# Patient Record
Sex: Male | Born: 1980 | Race: White | Hispanic: No | Smoking: Current every day smoker
Health system: Southern US, Community
[De-identification: ages and names within clinical notes are randomized; demographics above are authoritative.]

## PROBLEM LIST (undated history)

## (undated) DIAGNOSIS — E079 Disorder of thyroid, unspecified: Secondary | ICD-10-CM

## (undated) DIAGNOSIS — E785 Hyperlipidemia, unspecified: Secondary | ICD-10-CM

## (undated) DIAGNOSIS — R569 Unspecified convulsions: Secondary | ICD-10-CM

## (undated) DIAGNOSIS — F329 Major depressive disorder, single episode, unspecified: Secondary | ICD-10-CM

## (undated) DIAGNOSIS — F32A Depression, unspecified: Secondary | ICD-10-CM

## (undated) DIAGNOSIS — F4010 Social phobia, unspecified: Secondary | ICD-10-CM

## (undated) DIAGNOSIS — E2740 Unspecified adrenocortical insufficiency: Secondary | ICD-10-CM

## (undated) DIAGNOSIS — F4310 Post-traumatic stress disorder, unspecified: Secondary | ICD-10-CM

## (undated) HISTORY — PX: EAR TUBE REMOVAL: SHX1486

---

## 2014-10-29 ENCOUNTER — Encounter (HOSPITAL_COMMUNITY): Payer: Self-pay | Admitting: Emergency Medicine

## 2014-10-29 ENCOUNTER — Emergency Department (HOSPITAL_COMMUNITY)
Admission: EM | Admit: 2014-10-29 | Discharge: 2014-11-03 | Disposition: A | Discharge status: Home / Self Care | Payer: Self-pay | Attending: Emergency Medicine | Admitting: Emergency Medicine

## 2014-10-29 ENCOUNTER — Emergency Department (HOSPITAL_BASED_OUTPATIENT_CLINIC_OR_DEPARTMENT_OTHER): Payer: Self-pay

## 2014-10-29 ENCOUNTER — Encounter (HOSPITAL_BASED_OUTPATIENT_CLINIC_OR_DEPARTMENT_OTHER): Payer: Self-pay | Admitting: *Deleted

## 2014-10-29 ENCOUNTER — Emergency Department (HOSPITAL_BASED_OUTPATIENT_CLINIC_OR_DEPARTMENT_OTHER)
Admission: EM | Admit: 2014-10-29 | Discharge: 2014-10-29 | Disposition: A | Discharge status: Other Acute Inpatient Hospital | Payer: Self-pay | Attending: Emergency Medicine | Admitting: Emergency Medicine

## 2014-10-29 DIAGNOSIS — R45851 Suicidal ideations: Secondary | ICD-10-CM | POA: Insufficient documentation

## 2014-10-29 DIAGNOSIS — Z720 Tobacco use: Secondary | ICD-10-CM | POA: Insufficient documentation

## 2014-10-29 DIAGNOSIS — G40909 Epilepsy, unspecified, not intractable, without status epilepticus: Secondary | ICD-10-CM | POA: Insufficient documentation

## 2014-10-29 DIAGNOSIS — Z7952 Long term (current) use of systemic steroids: Secondary | ICD-10-CM | POA: Insufficient documentation

## 2014-10-29 DIAGNOSIS — F333 Major depressive disorder, recurrent, severe with psychotic symptoms: Secondary | ICD-10-CM | POA: Diagnosis present

## 2014-10-29 DIAGNOSIS — Z79899 Other long term (current) drug therapy: Secondary | ICD-10-CM | POA: Insufficient documentation

## 2014-10-29 DIAGNOSIS — Z8669 Personal history of other diseases of the nervous system and sense organs: Secondary | ICD-10-CM | POA: Insufficient documentation

## 2014-10-29 DIAGNOSIS — R443 Hallucinations, unspecified: Secondary | ICD-10-CM

## 2014-10-29 DIAGNOSIS — R52 Pain, unspecified: Secondary | ICD-10-CM

## 2014-10-29 DIAGNOSIS — E785 Hyperlipidemia, unspecified: Secondary | ICD-10-CM | POA: Insufficient documentation

## 2014-10-29 DIAGNOSIS — Z7951 Long term (current) use of inhaled steroids: Secondary | ICD-10-CM | POA: Insufficient documentation

## 2014-10-29 HISTORY — DX: Major depressive disorder, single episode, unspecified: F32.9

## 2014-10-29 HISTORY — DX: Unspecified convulsions: R56.9

## 2014-10-29 HISTORY — DX: Depression, unspecified: F32.A

## 2014-10-29 HISTORY — DX: Post-traumatic stress disorder, unspecified: F43.10

## 2014-10-29 HISTORY — DX: Hyperlipidemia, unspecified: E78.5

## 2014-10-29 HISTORY — DX: Unspecified adrenocortical insufficiency: E27.40

## 2014-10-29 HISTORY — DX: Social phobia, unspecified: F40.10

## 2014-10-29 LAB — URINALYSIS, ROUTINE W REFLEX MICROSCOPIC
Bilirubin Urine: NEGATIVE
GLUCOSE, UA: NEGATIVE mg/dL
HGB URINE DIPSTICK: NEGATIVE
Ketones, ur: NEGATIVE mg/dL
Leukocytes, UA: NEGATIVE
Nitrite: NEGATIVE
PROTEIN: NEGATIVE mg/dL
Specific Gravity, Urine: 1.022 (ref 1.005–1.030)
Urobilinogen, UA: 0.2 mg/dL (ref 0.0–1.0)
pH: 6.5 (ref 5.0–8.0)

## 2014-10-29 LAB — CBC WITH DIFFERENTIAL/PLATELET
BASOS ABS: 0 10*3/uL (ref 0.0–0.1)
Basophils Relative: 1 % (ref 0–1)
EOS ABS: 0.6 10*3/uL (ref 0.0–0.7)
Eosinophils Relative: 8 % — ABNORMAL HIGH (ref 0–5)
HCT: 46.1 % (ref 39.0–52.0)
Hemoglobin: 15.9 g/dL (ref 13.0–17.0)
LYMPHS ABS: 2.6 10*3/uL (ref 0.7–4.0)
Lymphocytes Relative: 34 % (ref 12–46)
MCH: 31.2 pg (ref 26.0–34.0)
MCHC: 34.5 g/dL (ref 30.0–36.0)
MCV: 90.4 fL (ref 78.0–100.0)
Monocytes Absolute: 0.8 10*3/uL (ref 0.1–1.0)
Monocytes Relative: 10 % (ref 3–12)
NEUTROS ABS: 3.6 10*3/uL (ref 1.7–7.7)
Neutrophils Relative %: 47 % (ref 43–77)
PLATELETS: 330 10*3/uL (ref 150–400)
RBC: 5.1 MIL/uL (ref 4.22–5.81)
RDW: 13.8 % (ref 11.5–15.5)
WBC: 7.6 10*3/uL (ref 4.0–10.5)

## 2014-10-29 LAB — RAPID URINE DRUG SCREEN, HOSP PERFORMED
Amphetamines: NOT DETECTED
Barbiturates: POSITIVE — AB
Benzodiazepines: POSITIVE — AB
Cocaine: NOT DETECTED
OPIATES: NOT DETECTED
TETRAHYDROCANNABINOL: NOT DETECTED

## 2014-10-29 LAB — COMPREHENSIVE METABOLIC PANEL
ALT: 22 U/L (ref 0–53)
ANION GAP: 10 (ref 5–15)
AST: 22 U/L (ref 0–37)
Albumin: 4.8 g/dL (ref 3.5–5.2)
Alkaline Phosphatase: 72 U/L (ref 39–117)
BUN: 13 mg/dL (ref 6–23)
CO2: 21 mmol/L (ref 19–32)
Calcium: 9.4 mg/dL (ref 8.4–10.5)
Chloride: 108 mmol/L (ref 96–112)
Creatinine, Ser: 0.85 mg/dL (ref 0.50–1.35)
GFR calc Af Amer: >90 mL/min (ref >90)
GFR calc non Af Amer: >90 mL/min (ref >90)
Glucose, Bld: 111 mg/dL — ABNORMAL HIGH (ref 70–99)
Potassium: 4 mmol/L (ref 3.5–5.1)
SODIUM: 139 mmol/L (ref 135–145)
TOTAL PROTEIN: 7.9 g/dL (ref 6.0–8.3)
Total Bilirubin: 0.4 mg/dL (ref 0.3–1.2)

## 2014-10-29 LAB — ETHANOL: Alcohol, Ethyl (B): <5 mg/dL (ref 0–9)

## 2014-10-29 LAB — ACETAMINOPHEN LEVEL

## 2014-10-29 LAB — SALICYLATE LEVEL: Salicylate Lvl: <4 mg/dL (ref 2.8–20.0)

## 2014-10-29 MED ORDER — HYDROCORTISONE 20 MG PO TABS
30.0000 mg | ORAL_TABLET | Freq: Every day | ORAL | Status: DC
  Filled 2014-10-29: qty 1

## 2014-10-29 MED ORDER — ZIPRASIDONE MESYLATE 20 MG IM SOLR
INTRAMUSCULAR | Status: AC
  Filled 2014-10-29: qty 20

## 2014-10-29 MED ORDER — LEVOTHYROXINE SODIUM 100 MCG PO TABS
100.0000 ug | ORAL_TABLET | Freq: Every day | ORAL | Status: DC
  Filled 2014-10-29: qty 1

## 2014-10-29 MED ORDER — NICOTINE 21 MG/24HR TD PT24
21.0000 mg | MEDICATED_PATCH | Freq: Every day | TRANSDERMAL | Status: DC
  Administered 2014-10-29: 21 mg via TRANSDERMAL
  Filled 2014-10-29: qty 1

## 2014-10-29 MED ORDER — IBUPROFEN 400 MG PO TABS: 600.0000 mg | ORAL_TABLET | Freq: Three times a day (TID) | ORAL | Status: DC | PRN

## 2014-10-29 MED ORDER — ZIPRASIDONE HCL 20 MG PO CAPS
20.0000 mg | ORAL_CAPSULE | Freq: Two times a day (BID) | ORAL | Status: DC
  Filled 2014-10-29: qty 1

## 2014-10-29 MED ORDER — RISPERIDONE 0.5 MG PO TABS: 4.0000 mg | ORAL_TABLET | Freq: Every day | ORAL | Status: DC

## 2014-10-29 MED ORDER — ZIPRASIDONE MESYLATE 20 MG IM SOLR
20.0000 mg | Freq: Once | INTRAMUSCULAR | Status: AC
  Administered 2014-10-29: 20 mg via INTRAMUSCULAR

## 2014-10-29 MED ORDER — IBUPROFEN 800 MG PO TABS
800.0000 mg | ORAL_TABLET | Freq: Once | ORAL | Status: AC
  Administered 2014-10-29: 800 mg via ORAL

## 2014-10-29 MED ORDER — HYDROCORTISONE 1 % EX CREA: TOPICAL_CREAM | Freq: Two times a day (BID) | CUTANEOUS | Status: DC

## 2014-10-29 MED ORDER — DULOXETINE HCL 60 MG PO CPEP
60.0000 mg | ORAL_CAPSULE | Freq: Every day | ORAL | Status: DC
  Filled 2014-10-29: qty 1

## 2014-10-29 MED ORDER — ALPRAZOLAM 0.5 MG PO TABS
1.0000 mg | ORAL_TABLET | Freq: Once | ORAL | Status: AC
  Administered 2014-10-29: 1 mg via ORAL
  Filled 2014-10-29: qty 2

## 2014-10-29 MED ORDER — PHENOBARBITAL 97.2 MG PO TABS
97.2000 mg | ORAL_TABLET | Freq: Three times a day (TID) | ORAL | Status: DC
  Filled 2014-10-29: qty 1

## 2014-10-29 MED ORDER — ACETAMINOPHEN 325 MG PO TABS: 650.0000 mg | ORAL_TABLET | Freq: Once | ORAL | Status: DC

## 2014-10-29 MED ORDER — IBUPROFEN 800 MG PO TABS
ORAL_TABLET | ORAL | Status: AC
  Filled 2014-10-29: qty 1

## 2014-10-29 MED ORDER — OXYCODONE-ACETAMINOPHEN 5-325 MG PO TABS
2.0000 | ORAL_TABLET | ORAL | Status: DC | PRN
  Administered 2014-10-29: 2 via ORAL
  Filled 2014-10-29: qty 2

## 2014-10-29 MED ORDER — OXYCODONE-ACETAMINOPHEN 5-325 MG PO TABS
2.0000 | ORAL_TABLET | Freq: Once | ORAL | Status: AC
Start: 2014-10-29 — End: 2014-10-29
  Administered 2014-10-29: 2 via ORAL
  Filled 2014-10-29: qty 2

## 2014-10-29 MED ORDER — LORAZEPAM 1 MG PO TABS: 1.0000 mg | ORAL_TABLET | Freq: Three times a day (TID) | ORAL | Status: DC | PRN

## 2014-10-29 MED ORDER — QUETIAPINE FUMARATE 300 MG PO TABS
300.0000 mg | ORAL_TABLET | Freq: Every day | ORAL | Status: DC
  Filled 2014-10-29: qty 1

## 2014-10-29 NOTE — ED Notes (Signed)
MD at bedside. 

## 2014-10-29 NOTE — BH Assessment (Addendum)
Tele Assessment Note   Mark [name] is an 34 y.o. male. Pt arrived voluntarily to [facility] reporting SI/HI. According to the Pt, he has 10 SI plans. Pt reports that he will become homicidal if he is released because he cannot control himself. Pt states that he is hearing demonic voices. Pt states that because he is a [place] he is upset that he is hearing demonic voices. Pt states that he has been diagnosed with depression, PTSD, and social aneixty. Pt states that he is currently prescribed Xanax, Cymbalta, Seroquel, and Risperdal. Pt reports that he was recently released from [facility] last week for SI and auditory hallucinations. Pt states that as soon as he left [facility] last week he began to have auditory hallucinations again. Pt was seen at [facility] in the ED for the same behaviors he is reporting today. Pt was assessed and released. According to the Pt, he was upset that he was released so he punched a wall.   Writer consulted with Lloyd Huger, NP. Per Lloyd Huger Pt meets inpatient criteria. Per Lloyd Huger Pt is not appropriate for [facility]. TTS to seek placement.  Axis I: Major Depression, Recurrent severe Axis II: Deferred Axis III:  Past Medical History  Diagnosis Date  . Depression   . PTSD (post-traumatic stress disorder)   . Social anxiety disorder   . Seizures   . Adrenal insufficiency   . Hyperlipidemia    Axis IV: educational problems, housing problems, other psychosocial or environmental problems and problems related to social environment Axis V: 31-40 impairment in reality testing  Past Medical History:  Past Medical History  Diagnosis Date  . Depression   . PTSD (post-traumatic stress disorder)   . Social anxiety disorder   . Seizures   . Adrenal insufficiency   . Hyperlipidemia     Past Surgical History  Procedure Laterality Date  . Ear tube removal      Family History: No family history on file.  Social History:  reports that he has been smoking.  He does not have any  smokeless tobacco history on file. He reports that he does not drink alcohol or use illicit drugs.  Additional Social History:  Alcohol / Drug Use Pain Medications: Pt deneis Prescriptions: Cymbalta, Risperdal, Seroquel, Xanax Over the Counter: Pt denies History of alcohol / drug use?: No history of alcohol / drug abuse Longest period of sobriety (when/how long): NA  CIWA: CIWA-Ar BP: 141/98 mmHg Pulse Rate: 74 COWS:    PATIENT STRENGTHS: (choose at least two) Communication skills Supportive family/friends  Allergies: No Known Allergies  Home Medications:  (Not in a hospital admission)  OB/GYN Status:  No LMP for male patient.  General Assessment Data Location of Assessment: [facility] Is this a Tele or Face-to-Face Assessment?: Tele Assessment Is this an Initial Assessment or a Re-assessment for this encounter?: Initial Assessment Living Arrangements: Other (Comment) (homeless) Can pt return to current living arrangement?: Yes Admission Status: Voluntary Is patient capable of signing voluntary admission?: Yes Transfer from: Other (Comment) Referral Source: Self/Family/Friend     [facility] Crisis Care Plan Living Arrangements: Other (Comment) (homeless) Name of Psychiatrist: [facility] Name of Therapist: [facility]  Education Status Is patient currently in school?: No Current Grade: NA Highest grade of school patient has completed: Some college Name of school: NA Contact person: NA  Risk to self with the past 6 months Suicidal Ideation: Yes-Currently Present Suicidal Intent: Yes-Currently Present Is patient at risk for suicide?: Yes Suicidal Plan?: Yes-Currently Present  Specify Current Suicidal Plan: To jump in front of truck Access to Means: Yes Specify Access to Suicidal Means: Access to mean What has been your use of drugs/alcohol within the last 12 months?: NA Previous Attempts/Gestures: Yes How many times?: 2 Other Self Harm Risks: NA Triggers for Past  Attempts: [employer](Robbery) Intentional Self Injurious Behavior: None Family Suicide History: No Recent stressful life event(s): Other (Comment) (homelessness) Persecutory voices/beliefs?: Yes Depression: Yes Depression Symptoms: Insomnia, Isolating, Tearfulness, Guilt, Loss of interest in usual pleasures, Feeling worthless/self pity, Feeling angry/irritable Substance abuse history and/or treatment for substance abuse?: No Suicide prevention information given to non-admitted patients: Not applicable  Risk to Others within the past 6 months Homicidal Ideation: No Thoughts of Harm to Others: No Current Homicidal Intent: No Current Homicidal Plan: No Access to Homicidal Means: No Identified Victim: NA History of harm to others?: No Assessment of Violence: None Noted Violent Behavior Description: NA Does patient have access to weapons?: No Criminal Charges Pending?: No Does patient have a court date: No  Psychosis Hallucinations: Auditory (hear demons) Delusions: None noted  Mental Status Report Appearance/Hygiene: Unremarkable, In scrubs Eye Contact: Good Motor Activity: Unremarkable Speech: Logical/coherent Level of Consciousness: Alert Mood: Depressed, Sad Affect: Appropriate to circumstance Anxiety Level: Minimal Thought Processes: Coherent, Relevant Judgement: Impaired Orientation: Person, Place, Time, Situation, Appropriate for developmental age Obsessive Compulsive Thoughts/Behaviors: None  Cognitive Functioning Concentration: Normal Memory: Recent Intact, Remote Intact IQ: Average Insight: Fair Impulse Control: Fair Appetite: Fair Weight Loss: 0 Weight Gain: 0 Sleep: Decreased Total Hours of Sleep: 5 Vegetative Symptoms: None  ADLScreening [facility] Assessment Services) Patient's cognitive ability adequate to safely complete daily activities?: Yes Patient able to express need for assistance with ADLs?: Yes Independently performs ADLs?: Yes (appropriate for  developmental age)  Prior Inpatient Therapy Prior Inpatient Therapy: Yes Prior Therapy Dates: 2016 [facility] Reason for Treatment: Depression  Prior Outpatient Therapy Prior Outpatient Therapy: Yes Prior Therapy Dates: 2016 [facility] Reason for Treatment: SI  ADL Screening (condition at time of admission) Patient's cognitive ability adequate to safely complete daily activities?: Yes Is the patient deaf or have difficulty hearing?: No Does the patient have difficulty seeing, even when wearing glasses/contacts?: No Does the patient have difficulty concentrating, remembering, or making decisions?: No Patient able to express need for assistance with ADLs?: Yes Does the patient have difficulty dressing or bathing?: No Independently performs ADLs?: Yes (appropriate for developmental age) Does the patient have difficulty walking or climbing stairs?: No       Abuse/Neglect Assessment (Assessment to be complete while patient is alone) Physical Abuse: Denies Verbal Abuse: Denies Sexual Abuse: Denies Exploitation of patient/patient's resources: Denies Self-Neglect: Denies     Merchant navy officerAdvance Directives (For Healthcare) Does patient have an advance directive?: No Would patient like information on creating an advanced directive?: No - patient declined information    Additional Information 1:1 In Past 12 Months?: No CIRT Risk: No Elopement Risk: No Does patient have medical clearance?: Yes     Disposition:  Disposition Initial Assessment Completed for this Encounter: Yes Disposition of Patient: Inpatient treatment program Type of inpatient treatment program: Adult  Rimsha Trembley D 10/29/2014 3:08 PM

## 2014-10-29 NOTE — ED Provider Notes (Addendum)
CSN: [id number]     Arrival date & time 10/29/14  1336 History   First MD Initiated Contact with Patient 10/29/14 1418     Chief Complaint  Patient presents with  . Psychiatric Evaluation     (Consider location/radiation/quality/duration/timing/severity/associated sxs/prior Treatment) HPI Comments: Patient presents for psychiatric evaluation. He has a history of depression and PTSD as well as social anxiety disorder. He states over the last few months he's been hearing voices. He was recently admitted to [facility] for the hallucinations. He says he was doing well until a few days ago when he started to hear voices again. He says they're getting very bad and he is hearing to demonic voices in foreign languages. He says that he is very upset about this because he is a [place] and she feels that voices are demonic. He is having thoughts of suicide. He says today he has thought about 10 different ways that he would kill himself. He's thought about running in front of a truck. He thought about jumping off a bridge. He went to [facility] in the ED where he had an assessment but at that point it was not thought that he needed inpatient admission. On his way out the door he got frustrated and punched a wall. He complains of constant throbbing pain to his right hand where he punched the wall. He denies any other injuries. He denies any other complaints. Of note he has a history of seizures and adrenal insufficiency needs and he has been off of his medications since yesterday.   Past Medical History  Diagnosis Date  . Depression   . PTSD (post-traumatic stress disorder)   . Social anxiety disorder   . Seizures   . Adrenal insufficiency   . Hyperlipidemia    Past Surgical History  Procedure Laterality Date  . Ear tube removal     No family history on file. History  Substance Use Topics  . Smoking status: Current Every Day Smoker  . Smokeless tobacco: Not on file  .  Alcohol Use: No    Review of Systems  Constitutional: Negative for fever, chills, diaphoresis and fatigue.  HENT: Negative for congestion, rhinorrhea and sneezing.   Eyes: Negative.   Respiratory: Negative for cough, chest tightness and shortness of breath.   Cardiovascular: Negative for chest pain and leg swelling.  Gastrointestinal: Negative for nausea, vomiting, abdominal pain, diarrhea and blood in stool.  Genitourinary: Negative for frequency, hematuria, flank pain and difficulty urinating.  Musculoskeletal: Positive for arthralgias. Negative for back pain.  Skin: Negative for rash.  Neurological: Negative for dizziness, speech difficulty, weakness, numbness and headaches.      Allergies  Review of patient's allergies indicates no known allergies.  Home Medications   Prior to Admission medications   Medication Sig Start Date End Date Taking? Authorizing Provider  alprazolam Prudy Feeler) 2 MG tablet Take 2 mg by mouth 2 (two) times daily.   Yes Historical Provider, MD  DULoxetine (CYMBALTA) 60 MG capsule Take 60 mg by mouth daily.   Yes Historical Provider, MD  fenofibrate 54 MG tablet Take 54 mg by mouth daily.   Yes Historical Provider, MD  hydrocortisone (CORTEF) 20 MG tablet Take 30 mg by mouth daily.   Yes Historical Provider, MD  levothyroxine (SYNTHROID, LEVOTHROID) 100 MCG tablet Take 100 mcg by mouth daily before breakfast.   Yes Historical Provider, MD  omega-3 acid ethyl esters (LOVAZA) 1 G capsule Take 2 g by mouth 2 (two)  times daily.   Yes Historical Provider, MD  PHENobarbital (LUMINAL) 97.2 MG tablet Take 97.2 mg by mouth 3 (three) times daily.   Yes Historical Provider, MD  QUEtiapine (SEROQUEL) 300 MG tablet Take 300 mg by mouth at bedtime.   Yes Historical Provider, MD  risperidone (RISPERDAL) 4 MG tablet Take 4 mg by mouth at bedtime.   Yes Historical Provider, MD   BP 141/98 mmHg  Pulse 74  Temp(Src) 98.2 F (36.8 C) (Oral)  Resp 18  Ht  (1.88 m)  Wt  300 lb (136.079 kg)  BMI 38.50 kg/m2  SpO2 99% Physical Exam  Constitutional: He is oriented to person, place, and time. He appears well-developed and well-nourished.  HENT:  Head: Normocephalic and atraumatic.  Eyes: Pupils are equal, round, and reactive to light.  Neck: Normal range of motion. Neck supple.  Cardiovascular: Normal rate, regular rhythm and normal heart sounds.   Pulmonary/Chest: Effort normal and breath sounds normal. No respiratory distress. He has no wheezes. He has no rales. He exhibits no tenderness.  Abdominal: Soft. Bowel sounds are normal. There is no tenderness. There is no rebound and no guarding.  Musculoskeletal: Normal range of motion. He exhibits edema and tenderness.  Patient has swelling and tenderness to the right hand primarily over the third MCP joint. He has normal sensation and motor function in the hand. Pedal pulses are intact. There is no pain in the wrist or forearm. No wounds are noted.  Lymphadenopathy:    He has no cervical adenopathy.  Neurological: He is alert and oriented to person, place, and time.  Skin: Skin is warm and dry. No rash noted.  Psychiatric: He has a normal mood and affect.    ED Course  Procedures (including critical care time) Labs Review Results for orders placed or performed during the hospital encounter of 10/29/14  Drug screen panel, emergency  Result Value Ref Range   Opiates NONE DETECTED NONE DETECTED   Cocaine NONE DETECTED NONE DETECTED   Benzodiazepines POSITIVE (A) NONE DETECTED   Amphetamines NONE DETECTED NONE DETECTED   Tetrahydrocannabinol NONE DETECTED NONE DETECTED   Barbiturates POSITIVE (A) NONE DETECTED  Urinalysis, Routine w reflex microscopic  Result Value Ref Range   Color, Urine YELLOW YELLOW   APPearance CLEAR CLEAR   Specific Gravity, Urine 1.022 1.005 - 1.030   pH 6.5 5.0 - 8.0   Glucose, UA NEGATIVE NEGATIVE mg/dL   Hgb urine dipstick NEGATIVE NEGATIVE   Bilirubin Urine NEGATIVE  NEGATIVE   Ketones, ur NEGATIVE NEGATIVE mg/dL   Protein, ur NEGATIVE NEGATIVE mg/dL   Urobilinogen, UA 0.2 0.0 - 1.0 mg/dL   Nitrite NEGATIVE NEGATIVE   Leukocytes, UA NEGATIVE NEGATIVE  CBC with Differential  Result Value Ref Range   WBC 7.6 4.0 - 10.5 K/uL   RBC 5.10 4.22 - 5.81 MIL/uL   Hemoglobin 15.9 13.0 - 17.0 g/dL   HCT 96.0 45.4 - 09.8 %   MCV 90.4 78.0 - 100.0 fL   MCH 31.2 26.0 - 34.0 pg   MCHC 34.5 30.0 - 36.0 g/dL   RDW 11.9 14.7 - 82.9 %   Platelets 330 150 - 400 K/uL   Neutrophils Relative % 47 43 - 77 %   Neutro Abs 3.6 1.7 - 7.7 K/uL   Lymphocytes Relative 34 12 - 46 %   Lymphs Abs 2.6 0.7 - 4.0 K/uL   Monocytes Relative 10 3 - 12 %   Monocytes Absolute 0.8 0.1 - 1.0 K/uL  Eosinophils Relative 8 (H) 0 - 5 %   Eosinophils Absolute 0.6 0.0 - 0.7 K/uL   Basophils Relative 1 0 - 1 %   Basophils Absolute 0.0 0.0 - 0.1 K/uL  Comprehensive metabolic panel  Result Value Ref Range   Sodium 139 135 - 145 mmol/L   Potassium 4.0 3.5 - 5.1 mmol/L   Chloride 108 96 - 112 mmol/L   CO2 21 19 - 32 mmol/L   Glucose, Bld 111 (H) 70 - 99 mg/dL   BUN 13 6 - 23 mg/dL   Creatinine, Ser 1.610.85 0.50 - 1.35 mg/dL   Calcium 9.4 8.4 - 09.610.5 mg/dL   Total Protein 7.9 6.0 - 8.3 g/dL   Albumin 4.8 3.5 - 5.2 g/dL   AST 22 0 - 37 U/L   ALT 22 0 - 53 U/L   Alkaline Phosphatase 72 39 - 117 U/L   Total Bilirubin 0.4 0.3 - 1.2 mg/dL   GFR calc non Af Amer >90 >90 mL/min   GFR calc Af Amer >90 >90 mL/min   Anion gap 10 5 - 15  Salicylate level  Result Value Ref Range   Salicylate Lvl <4.0 2.8 - 20.0 mg/dL   Dg Hand Complete Right  10/29/2014   CLINICAL DATA:  Right hand pain after punching a wall.  EXAM: RIGHT HAND - COMPLETE 3+ VIEW  COMPARISON:  None.  FINDINGS: There is no acute fracture or dislocation. There is soft tissue swelling over the metacarpal phalangeal joints. There is an old healed fracture of the distal fifth metacarpal.  IMPRESSION: No acute osseous abnormality.  Soft  tissue swelling.   Electronically Signed   By: Francene BoyersJames  Maxwell M.D.   On: 10/29/2014 14:44      Imaging Review Dg Hand Complete Right  10/29/2014   CLINICAL DATA:  Right hand pain after punching a wall.  EXAM: RIGHT HAND - COMPLETE 3+ VIEW  COMPARISON:  None.  FINDINGS: There is no acute fracture or dislocation. There is soft tissue swelling over the metacarpal phalangeal joints. There is an old healed fracture of the distal fifth metacarpal.  IMPRESSION: No acute osseous abnormality.  Soft tissue swelling.   Electronically Signed   By: Francene BoyersJames  Maxwell M.D.   On: 10/29/2014 14:44     EKG Interpretation   Date/Time:  Friday October 29 2014 14:40:23 EDT Ventricular Rate:  79 PR Interval:  178 QRS Duration: 112 QT Interval:  380 QTC Calculation: 435 R Axis:   82 Text Interpretation:  Normal sinus rhythm Normal ECG No old tracing to  compare Confirmed by Margueritte Guthridge  MD, Namir Neto ([id number]) on 10/29/2014 2:44:11 PM      MDM   Final diagnoses:  Hallucination  Suicidal ideations    We will place a consult to TTS and place psych hold orders. There is no evidence of fracture to his right hand. An Ace wrap was placed around the hand. Patient was given dose of Geodon for his hallucinations.    Rolan BuccoMelanie Cason Luffman, MD 10/29/14 1507  Rolan BuccoMelanie Shealee Yordy, MD 10/29/14 [phone]

## 2014-10-29 NOTE — ED Notes (Signed)
[employer] at bedside, 2 uniformed officers present, unable to tx  Due to type of restraint device needed for tx. Awaiting second [employer] to arrive with correct restraint device

## 2014-10-29 NOTE — BH Assessment (Signed)
Writer informed TTS Brandi of the consult.  

## 2014-10-29 NOTE — ED Notes (Addendum)
Pt sts he was released [facility] [place] last week after being admitted for hearing voices. While there, he was on Risperdal which helped with his voices. Pt sts the meds are no longer keeping him from hearing the voices. [facility] and they refused to admit him. Pt became angry and punched a wall. Pt sts the voices sound demonic and he cannot understand what they are saying. He sts that he wants to kill himself because he thinks he may be possessed.

## 2014-10-29 NOTE — ED Provider Notes (Signed)
Psychiatry is seen patient and feels he needs to be admitted. Due to the patient having homicidal thoughts [facility]. Given they will be searching for placement, [facility]. D/w ED physician, Dr. Madilyn Hookees, who accepts transfer. Due to patient's SI/HI, will be IVC'd.  Pricilla LovelessScott Mava Suares, MD 10/29/14 [phone]

## 2014-10-29 NOTE — Progress Notes (Signed)
CSW referred patient to the following hospitals: [facility], [facility], [place] [facility], [facility], [facility], and [facility].  CSW will continue to seek placement.  Catia Raeanne Deschler, LCSWA Disposition staff 10/29/2014 5:12 PM  

## 2014-10-29 NOTE — ED Notes (Signed)
Pt sts he was released from [facility] in [place] last week after being admitted for hearing voices. While there, he was on Risperdal which helped with his voices. Pt sts the meds are no longer keeping him from hearing the voices. [facility] and they refused to admit him. Pt became angry and punched a wall. Pt sts the voices sound demonic and he cannot understand what they are saying. He sts that he wants to kill himself because he thinks he may be possessed. Patient was transferred from [facility] to wait on placement.

## 2014-10-29 NOTE — ED Notes (Signed)
TTS consult.  Pt cooperative and talking with counselor.

## 2014-10-29 NOTE — ED Notes (Signed)
[employer] was under the impression pt's right extremity was broken, therefore they were under the impression he needed shackels. They were informed that his hand was not fractured per md and that handcuffs were appropriate. Pt agreeable and transported via [facility]

## 2014-10-29 NOTE — ED Notes (Signed)
Patient transported to X-ray ambulatory with sitter.

## 2014-10-29 NOTE — ED Notes (Signed)
Pt informed of transfer to [facility].

## 2014-10-29 NOTE — ED Notes (Signed)
[facility] Expected date:  Expected time:  Means of arrival:  Comments: [facility]

## 2014-10-29 NOTE — ED Notes (Signed)
[place] [facility] has been called to [facility].

## 2014-10-29 NOTE — ED Notes (Signed)
Pt placed in ED room 12 and security at bedside,  Staffing called for sitter and no sitter available at this time.

## 2014-10-29 NOTE — ED Notes (Signed)
Contact communications for transport to [facility], notified that [facility] will have to provide transportation since pt was brought him by [facility] at start of care. Awaiting tx to [facility]

## 2014-10-30 DIAGNOSIS — F339 Major depressive disorder, recurrent, unspecified: Secondary | ICD-10-CM | POA: Insufficient documentation

## 2014-10-30 DIAGNOSIS — R45851 Suicidal ideations: Secondary | ICD-10-CM

## 2014-10-30 DIAGNOSIS — F333 Major depressive disorder, recurrent, severe with psychotic symptoms: Secondary | ICD-10-CM

## 2014-10-30 MED ORDER — HYDROCORTISONE 20 MG PO TABS: 30.0000 mg | ORAL_TABLET | Freq: Two times a day (BID) | ORAL | Status: DC

## 2014-10-30 MED ORDER — TRAZODONE HCL 100 MG PO TABS
100.0000 mg | ORAL_TABLET | Freq: Every evening | ORAL | Status: DC | PRN
  Filled 2014-10-30: qty 1

## 2014-10-30 MED ORDER — PHENOBARBITAL 97.2 MG PO TABS
97.2000 mg | ORAL_TABLET | Freq: Three times a day (TID) | ORAL | Status: DC
  Administered 2014-10-30 – 2014-11-03 (×14): 97.2 mg via ORAL
  Filled 2014-10-30 (×14): qty 1

## 2014-10-30 MED ORDER — QUETIAPINE FUMARATE 300 MG PO TABS
300.0000 mg | ORAL_TABLET | Freq: Every day | ORAL | Status: DC
  Administered 2014-10-30: 300 mg via ORAL

## 2014-10-30 MED ORDER — NICOTINE 21 MG/24HR TD PT24
21.0000 mg | MEDICATED_PATCH | Freq: Every day | TRANSDERMAL | Status: DC
  Administered 2014-10-30 – 2014-11-03 (×5): 21 mg via TRANSDERMAL
  Filled 2014-10-30 (×4): qty 1

## 2014-10-30 MED ORDER — DULOXETINE HCL 60 MG PO CPEP
60.0000 mg | ORAL_CAPSULE | Freq: Every day | ORAL | Status: DC
  Administered 2014-10-30 – 2014-11-03 (×5): 60 mg via ORAL
  Filled 2014-10-30 (×5): qty 1

## 2014-10-30 MED ORDER — CLONAZEPAM 1 MG PO TABS
1.0000 mg | ORAL_TABLET | Freq: Two times a day (BID) | ORAL | Status: DC | PRN
  Administered 2014-10-30 – 2014-10-31 (×3): 1 mg via ORAL
  Filled 2014-10-30 (×3): qty 1

## 2014-10-30 MED ORDER — IBUPROFEN 800 MG PO TABS
800.0000 mg | ORAL_TABLET | Freq: Four times a day (QID) | ORAL | Status: DC | PRN
  Administered 2014-10-31 – 2014-11-01 (×2): 800 mg via ORAL
  Filled 2014-10-30 (×2): qty 1

## 2014-10-30 MED ORDER — ACETAMINOPHEN 325 MG PO TABS
650.0000 mg | ORAL_TABLET | Freq: Four times a day (QID) | ORAL | Status: DC | PRN
  Administered 2014-10-30 – 2014-11-01 (×2): 650 mg via ORAL
  Filled 2014-10-30 (×2): qty 2

## 2014-10-30 MED ORDER — ZOLPIDEM TARTRATE 5 MG PO TABS
5.0000 mg | ORAL_TABLET | Freq: Every evening | ORAL | Status: DC | PRN
  Administered 2014-10-30: 5 mg via ORAL
  Filled 2014-10-30: qty 1

## 2014-10-30 MED ORDER — OMEGA-3-ACID ETHYL ESTERS 1 G PO CAPS: 2.0000 g | ORAL_CAPSULE | Freq: Two times a day (BID) | ORAL | Status: DC

## 2014-10-30 MED ORDER — ALPRAZOLAM 1 MG PO TABS
2.0000 mg | ORAL_TABLET | Freq: Two times a day (BID) | ORAL | Status: DC
  Administered 2014-10-30: 2 mg via ORAL
  Filled 2014-10-30: qty 2

## 2014-10-30 MED ORDER — LEVOTHYROXINE SODIUM 100 MCG PO TABS
100.0000 ug | ORAL_TABLET | Freq: Every day | ORAL | Status: DC
  Administered 2014-10-30 – 2014-11-03 (×5): 100 ug via ORAL
  Filled 2014-10-30 (×6): qty 1

## 2014-10-30 MED ORDER — FENOFIBRATE 54 MG PO TABS
54.0000 mg | ORAL_TABLET | Freq: Every day | ORAL | Status: DC
  Administered 2014-10-30 – 2014-11-03 (×5): 54 mg via ORAL
  Filled 2014-10-30 (×5): qty 1

## 2014-10-30 MED ORDER — RISPERIDONE 2 MG PO TABS
4.0000 mg | ORAL_TABLET | Freq: Every day | ORAL | Status: DC
  Filled 2014-10-30: qty 2

## 2014-10-30 MED ORDER — HYDROCORTISONE 20 MG PO TABS
30.0000 mg | ORAL_TABLET | Freq: Two times a day (BID) | ORAL | Status: DC
  Administered 2014-10-30 – 2014-11-03 (×9): 30 mg via ORAL
  Filled 2014-10-30 (×10): qty 1

## 2014-10-30 MED ORDER — QUETIAPINE FUMARATE 300 MG PO TABS
300.0000 mg | ORAL_TABLET | Freq: Every day | ORAL | Status: DC
  Filled 2014-10-30: qty 1

## 2014-10-30 MED ORDER — RISPERIDONE 2 MG PO TABS
4.0000 mg | ORAL_TABLET | Freq: Every day | ORAL | Status: DC
  Administered 2014-10-30: 4 mg via ORAL

## 2014-10-30 MED ORDER — OMEGA-3-ACID ETHYL ESTERS 1 G PO CAPS
2.0000 g | ORAL_CAPSULE | Freq: Two times a day (BID) | ORAL | Status: DC
  Administered 2014-10-30 – 2014-11-03 (×9): 2 g via ORAL
  Filled 2014-10-30 (×10): qty 2

## 2014-10-30 MED ORDER — FLUTICASONE PROPIONATE 50 MCG/ACT NA SUSP
2.0000 | Freq: Every day | NASAL | Status: DC
Start: 2014-10-30 — End: 2014-11-03
  Administered 2014-10-30 – 2014-11-03 (×5): 2 via NASAL
  Filled 2014-10-30: qty 16

## 2014-10-30 MED ORDER — RISPERIDONE 2 MG PO TABS
3.0000 mg | ORAL_TABLET | Freq: Every day | ORAL | Status: DC
  Administered 2014-10-30 – 2014-11-02 (×4): 3 mg via ORAL
  Filled 2014-10-30 (×8): qty 1

## 2014-10-30 MED ORDER — BENZTROPINE MESYLATE 1 MG PO TABS
0.5000 mg | ORAL_TABLET | Freq: Two times a day (BID) | ORAL | Status: DC
  Administered 2014-10-30 – 2014-11-03 (×9): 0.5 mg via ORAL
  Filled 2014-10-30 (×9): qty 1

## 2014-10-30 MED ORDER — ALPRAZOLAM 1 MG PO TABS: 2.0000 mg | ORAL_TABLET | Freq: Two times a day (BID) | ORAL | Status: DC

## 2014-10-30 MED ORDER — HYDROCORTISONE 1 % EX CREA
TOPICAL_CREAM | Freq: Three times a day (TID) | CUTANEOUS | Status: DC
  Administered 2014-10-30 – 2014-11-03 (×9): via TOPICAL
  Filled 2014-10-30: qty 28

## 2014-10-30 NOTE — ED Notes (Signed)
Pt high acuity. IVC, passive SI

## 2014-10-30 NOTE — ED Notes (Addendum)
Pt acuity high due to IVC. Pt calm and cooperative currently.

## 2014-10-30 NOTE — ED Notes (Signed)
[facility] by [employer] to treatment area 41. Patient admits to SI and AH but denies HI and VH at this time. Patient oriented to unit and plan of care discuss with patient. Patient voices no concerns at this time. Encouragement and support provided and safety maintain. Q 15 min safety checks in place.

## 2014-10-30 NOTE — ED Notes (Signed)
Patient resting quietly in bed watching TV, Respirations equal and unlabored, skin warm and dry, NAD. No change in assessment or acuity. Q 15 minute safety checks remain in place.   

## 2014-10-30 NOTE — Consult Note (Signed)
[facility] Consult   Reason for Consult:  Suicidal ideations Referring Physician:  EDP Patient Identification: Mark [name] MRN:  [id number] Principal Diagnosis: Severe recurrent major depression with psychotic features Diagnosis:   Patient Active Problem List   Diagnosis Date Noted  . Severe recurrent major depression with psychotic features [F33.3] 10/30/2014    Priority: High  . Suicidal ideations [R45.851] 10/30/2014    Priority: High    Total Time spent with patient: 45 minutes  Subjective:   Mark [name] is a 34 y.o. male patient admitted with suicidal ideations and a plan to overdose, hallucinations.  HPI:  The patient presented to [facility] initially but they discharged him.  On the way out of the facility, he punched a wall and then went to the [place] [facility] [employer].  He stated he was hearing voices that were telling him to kill himself and others.  Mark [name] was recently at [facility] for a similar presentation.  Today, he denies homicidal ideations but his history was not consistent.  He initially stated he had not heard voices until two weeks ago but later stated he heard them at least two months ago.  Mark [name] reports prior suicide attempts, cutting his wrist and jumping off a bridge.  Hospitalized in [place], [place] and at [facility].  Denies having any support, unsure of living situation as patient did not elaborate except he lived alone.   HPI Elements:   Location:  generalized. Quality:  acute. Severity:  severe. Timing:  constant. Duration:  two weeks. Context:  stressors.  Past Medical History:  Past Medical History  Diagnosis Date  . Depression   . PTSD (post-traumatic stress disorder)   . Social anxiety disorder   . Seizures   . Adrenal insufficiency   . Hyperlipidemia     Past Surgical History  Procedure Laterality Date  . Ear tube removal     Family History: History reviewed. No pertinent family history. Social History:  History   Alcohol Use No     History  Drug Use No    History   Social History  . Marital Status: Divorced    Spouse Name: N/A  . Number of Children: N/A  . Years of Education: N/A   Social History Main Topics  . Smoking status: Current Every Day Smoker  . Smokeless tobacco: Not on file  . Alcohol Use: No  . Drug Use: No  . Sexual Activity: Not on file   Other Topics Concern  . None   Social History Narrative   Additional Social History:                          Allergies:  No Known Allergies  Labs:  Results for orders placed or performed during the hospital encounter of 10/29/14 (from the past 48 hour(s))  Drug screen panel, emergency     Status: Abnormal   Collection Time: 10/29/14  2:00 PM  Result Value Ref Range   Opiates NONE DETECTED NONE DETECTED   Cocaine NONE DETECTED NONE DETECTED   Benzodiazepines POSITIVE (A) NONE DETECTED   Amphetamines NONE DETECTED NONE DETECTED   Tetrahydrocannabinol NONE DETECTED NONE DETECTED   Barbiturates POSITIVE (A) NONE DETECTED    Comment:        DRUG SCREEN FOR MEDICAL PURPOSES ONLY.  IF CONFIRMATION IS NEEDED FOR ANY PURPOSE, NOTIFY LAB WITHIN 5 DAYS.        LOWEST DETECTABLE LIMITS  FOR URINE DRUG SCREEN Drug Class       Cutoff (ng/mL) Amphetamine      1000 Barbiturate      200 Benzodiazepine   416 Tricyclics       384 Opiates          300 Cocaine          300 THC              50   Urinalysis, Routine w reflex microscopic     Status: None   Collection Time: 10/29/14  2:00 PM  Result Value Ref Range   Color, Urine YELLOW YELLOW   APPearance CLEAR CLEAR   Specific Gravity, Urine 1.022 1.005 - 1.030   pH 6.5 5.0 - 8.0   Glucose, UA NEGATIVE NEGATIVE mg/dL   Hgb urine dipstick NEGATIVE NEGATIVE   Bilirubin Urine NEGATIVE NEGATIVE   Ketones, ur NEGATIVE NEGATIVE mg/dL   Protein, ur NEGATIVE NEGATIVE mg/dL   Urobilinogen, UA 0.2 0.0 - 1.0 mg/dL   Nitrite NEGATIVE NEGATIVE   Leukocytes, UA NEGATIVE NEGATIVE     Comment: MICROSCOPIC NOT DONE ON URINES WITH NEGATIVE PROTEIN, BLOOD, LEUKOCYTES, NITRITE, OR GLUCOSE <1000 mg/dL.  CBC with Differential     Status: Abnormal   Collection Time: 10/29/14  2:20 PM  Result Value Ref Range   WBC 7.6 4.0 - 10.5 K/uL   RBC 5.10 4.22 - 5.81 MIL/uL   Hemoglobin 15.9 13.0 - 17.0 g/dL   HCT 46.1 39.0 - 52.0 %   MCV 90.4 78.0 - 100.0 fL   MCH 31.2 26.0 - 34.0 pg   MCHC 34.5 30.0 - 36.0 g/dL   RDW 13.8 11.5 - 15.5 %   Platelets 330 150 - 400 K/uL   Neutrophils Relative % 47 43 - 77 %   Neutro Abs 3.6 1.7 - 7.7 K/uL   Lymphocytes Relative 34 12 - 46 %   Lymphs Abs 2.6 0.7 - 4.0 K/uL   Monocytes Relative 10 3 - 12 %   Monocytes Absolute 0.8 0.1 - 1.0 K/uL   Eosinophils Relative 8 (H) 0 - 5 %   Eosinophils Absolute 0.6 0.0 - 0.7 K/uL   Basophils Relative 1 0 - 1 %   Basophils Absolute 0.0 0.0 - 0.1 K/uL  Comprehensive metabolic panel     Status: Abnormal   Collection Time: 10/29/14  2:20 PM  Result Value Ref Range   Sodium 139 135 - 145 mmol/L   Potassium 4.0 3.5 - 5.1 mmol/L   Chloride 108 96 - 112 mmol/L   CO2 21 19 - 32 mmol/L   Glucose, Bld 111 (H) 70 - 99 mg/dL   BUN 13 6 - 23 mg/dL   Creatinine, Ser 0.85 0.50 - 1.35 mg/dL   Calcium 9.4 8.4 - 10.5 mg/dL   Total Protein 7.9 6.0 - 8.3 g/dL   Albumin 4.8 3.5 - 5.2 g/dL   AST 22 0 - 37 U/L   ALT 22 0 - 53 U/L   Alkaline Phosphatase 72 39 - 117 U/L   Total Bilirubin 0.4 0.3 - 1.2 mg/dL   GFR calc non Af Amer >90 >90 mL/min   GFR calc Af Amer >90 >90 mL/min    Comment: (NOTE) The eGFR has been calculated using the CKD EPI equation. This calculation has not been validated in all clinical situations. eGFR's persistently <90 mL/min signify possible Chronic Kidney Disease.    Anion gap 10 5 - 15  Ethanol  Status: None   Collection Time: 10/29/14  2:20 PM  Result Value Ref Range   Alcohol, Ethyl (B) <5 0 - 9 mg/dL    Comment:        LOWEST DETECTABLE LIMIT FOR SERUM ALCOHOL IS 11 mg/dL FOR  MEDICAL PURPOSES ONLY   Salicylate level     Status: None   Collection Time: 10/29/14  2:20 PM  Result Value Ref Range   Salicylate Lvl <8.9 2.8 - 20.0 mg/dL  Acetaminophen level     Status: Abnormal   Collection Time: 10/29/14  2:20 PM  Result Value Ref Range   Acetaminophen (Tylenol), Serum <10.0 (L) 10 - 30 ug/mL    Comment:        THERAPEUTIC CONCENTRATIONS VARY SIGNIFICANTLY. A RANGE OF 10-30 ug/mL MAY BE AN EFFECTIVE CONCENTRATION FOR MANY PATIENTS. HOWEVER, SOME ARE BEST TREATED AT CONCENTRATIONS OUTSIDE THIS RANGE. ACETAMINOPHEN CONCENTRATIONS >150 ug/mL AT 4 HOURS AFTER INGESTION AND >50 ug/mL AT 12 HOURS AFTER INGESTION ARE OFTEN ASSOCIATED WITH TOXIC REACTIONS.     Vitals: Blood pressure 106/62, pulse 75, temperature 98.6 F (37 C), temperature source Oral, resp. rate 16, SpO2 98 %.  Risk to Self: Is patient at risk for suicide?: Yes Risk to Others:   Prior Inpatient Therapy:   Prior Outpatient Therapy:    Current Facility-Administered Medications  Medication Dose Route Frequency Provider Last Rate Last Dose  . acetaminophen (TYLENOL) tablet 650 mg  650 mg Oral Q6H PRN Harriet Butte, NP   650 mg at 10/30/14 0041  . benztropine (COGENTIN) tablet 0.5 mg  0.5 mg Oral BID Griselle Rufer   0.5 mg at 10/30/14 1140  . clonazePAM (KLONOPIN) tablet 1 mg  1 mg Oral BID PRN Patrecia Pour, NP   1 mg at 10/30/14 1000  . DULoxetine (CYMBALTA) DR capsule 60 mg  60 mg Oral Daily Harriet Butte, NP   60 mg at 10/30/14 0951  . fenofibrate tablet 54 mg  54 mg Oral Daily Harriet Butte, NP   54 mg at 10/30/14 0951  . fluticasone (FLONASE) 50 MCG/ACT nasal spray 2 spray  2 spray Each Nare Daily Harriet Butte, NP   2 spray at 10/30/14 0951  . hydrocortisone (CORTEF) tablet 30 mg  30 mg Oral BID Harriet Butte, NP   30 mg at 10/30/14 0951  . ibuprofen (ADVIL,MOTRIN) tablet 800 mg  800 mg Oral Q6H PRN Harriet Butte, NP      . levothyroxine (SYNTHROID, LEVOTHROID) tablet 100  mcg  100 mcg Oral QAC breakfast Harriet Butte, NP   100 mcg at 10/30/14 2119  . nicotine (NICODERM CQ - dosed in mg/24 hours) patch 21 mg  21 mg Transdermal Daily Patrecia Pour, NP   21 mg at 10/30/14 1206  . omega-3 acid ethyl esters (LOVAZA) capsule 2 g  2 g Oral BID Harriet Butte, NP   2 g at 10/30/14 0951  . PHENobarbital (LUMINAL) tablet 97.2 mg  97.2 mg Oral TID Harriet Butte, NP   97.2 mg at 10/30/14 0951  . risperiDONE (RISPERDAL) tablet 3 mg  3 mg Oral QHS Miyu Fenderson      . traZODone (DESYREL) tablet 100 mg  100 mg Oral QHS PRN Zavien Clubb       Current Outpatient Prescriptions  Medication Sig Dispense Refill  . fluticasone (FLONASE) 50 MCG/ACT nasal spray Place 2 sprays into both nostrils daily.    Marland Kitchen alprazolam (XANAX) 2 MG  tablet Take 2 mg by mouth 2 (two) times daily.    . DULoxetine (CYMBALTA) 60 MG capsule Take 60 mg by mouth daily.    . fenofibrate 54 MG tablet Take 54 mg by mouth daily.    . hydrocortisone (CORTEF) 20 MG tablet Take 30 mg by mouth 2 (two) times daily.     Marland Kitchen levothyroxine (SYNTHROID, LEVOTHROID) 100 MCG tablet Take 100 mcg by mouth daily before breakfast.    . omega-3 acid ethyl esters (LOVAZA) 1 G capsule Take 2 g by mouth 2 (two) times daily.    Marland Kitchen PHENobarbital (LUMINAL) 97.2 MG tablet Take 97.2 mg by mouth 3 (three) times daily.    . QUEtiapine (SEROQUEL) 300 MG tablet Take 300 mg by mouth at bedtime.    . risperidone (RISPERDAL) 4 MG tablet Take 4 mg by mouth at bedtime.      Musculoskeletal: Strength & Muscle Tone: within normal limits Gait & Station: normal Patient leans: N/A  Psychiatric Specialty Exam:     Blood pressure 106/62, pulse 75, temperature 98.6 F (37 C), temperature source Oral, resp. rate 16, SpO2 98 %.There is no weight on file to calculate BMI.  General Appearance: Disheveled  Eye Contact::  Minimal  Speech:  Normal Rate  Volume:  Normal  Mood:  Irritable  Affect:  Blunt  Thought Process:  Coherent   Orientation:  Full (Time, Place, and Person)  Thought Content:  Hallucinations: Auditory Command:  kill himself  Suicidal Thoughts:  Yes.  with intent/plan  Homicidal Thoughts:  No  Memory:  Immediate;   Fair Recent;   Fair Remote;   Fair  Judgement:  Impaired  Insight:  Fair  Psychomotor Activity:  Decreased  Concentration:  Fair  Recall:  [employer] of Knowledge:Fair  Language: Good  Akathisia:  No  Handed:  Right  AIMS (if indicated):     Assets:  Leisure Time Physical Health Resilience  ADL's:  Intact  Cognition: WNL  Sleep:      Medical Decision Making: Review of Psycho-Social Stressors (1), Review or order clinical lab tests (1) and Review of Medication Regimen & Side Effects (2)  Treatment Plan Summary: Daily contact with patient to assess and evaluate symptoms and progress in treatment, Medication management and Plan Admit to inpatient psychiatry for stabilization  Plan:  Recommend psychiatric Inpatient admission when medically cleared. Disposition: Admit to inpatient psychiatry for stabilization  Waylan Boga, PMH-NP 10/30/2014 3:00 PM Patient seen face-to-face for psychiatric evaluation, chart reviewed and case discussed with the physician extender and developed treatment plan. Reviewed the information documented and agree with the treatment plan. Corena Pilgrim, MD

## 2014-10-30 NOTE — ED Notes (Signed)
Patient resting quietly in bed with eyes closed, Respirations equal and unlabored, skin warm and dry, NAD. No change in assessment or acuity. Q 15 minute safety checks remain in place.   

## 2014-10-30 NOTE — ED Notes (Signed)
Patient pleasant and cooperative with staff. No s/s of distress noted at this time. Pt states he has passive SI with no plans at this time.

## 2014-10-31 MED ORDER — CLONAZEPAM 0.5 MG PO TABS
0.5000 mg | ORAL_TABLET | Freq: Two times a day (BID) | ORAL | Status: DC
  Administered 2014-10-31 – 2014-11-03 (×6): 0.5 mg via ORAL
  Filled 2014-10-31 (×6): qty 1

## 2014-10-31 MED ORDER — HYDROXYZINE HCL 25 MG PO TABS
100.0000 mg | ORAL_TABLET | Freq: Every day | ORAL | Status: DC
  Administered 2014-10-31 – 2014-11-02 (×3): 100 mg via ORAL
  Filled 2014-10-31 (×3): qty 4

## 2014-10-31 MED ORDER — ZIPRASIDONE MESYLATE 20 MG IM SOLR
20.0000 mg | Freq: Once | INTRAMUSCULAR | Status: AC | PRN
  Administered 2014-10-31: 20 mg via INTRAMUSCULAR
  Filled 2014-10-31: qty 20

## 2014-10-31 MED ORDER — DIPHENHYDRAMINE HCL 50 MG/ML IJ SOLN
50.0000 mg | Freq: Once | INTRAMUSCULAR | Status: AC
  Administered 2014-10-31: 50 mg via INTRAMUSCULAR
  Filled 2014-10-31: qty 1

## 2014-10-31 MED ORDER — HYDROXYZINE HCL 25 MG PO TABS: 50.0000 mg | ORAL_TABLET | Freq: Every day | ORAL | Status: DC

## 2014-10-31 NOTE — Consult Note (Signed)
[facility]   Reason for Consult:  Suicidal ideations Referring Physician:  EDP Patient Identification: Mark BeckmannRandy S Oliveto MRN:  [id number] Principal Diagnosis: Severe recurrent major depression with psychotic features Diagnosis:   Patient Active Problem List   Diagnosis Date Noted  . Severe recurrent major depression with psychotic features [F33.3] 10/30/2014    Priority: High  . Suicidal ideations [R45.851] 10/30/2014    Priority: High    Total Time spent with patient: 45 minutes  Subjective:   Mark [name] is a 34 y.o. male patient admitted with suicidal ideations and a plan to overdose, hallucinations.  HPI:  The patient appears to be malingering with frequent requests for Xanax, Seroquel.  He initially reported taking Xanax 2 mg QID, then changed it to BID.  Yesterday, it was changed to Klonopin and he has had no withdrawal type of symptoms.  He reports being so anxious that he can't leave his room but left his room and went to the dayroom, shortly after his assessment and this statement.  His vital signs have been stable with no increases.  He continues to say he hears demonic voices but does not appear to be responding to internal stimuli.  MD educated him on the use of long term benzodiazepines and how his goal is to taper him off of these medications while starting a SSRI since "9 years" of the use of Xanax has not decreased his anxiety.  Patient still continues to demand Xanax and Seroquel.  Seroquel discontinued due to his concurrent use of Risperdal. HPI Elements:   Location:  generalized. Quality:  acute. Severity:  severe. Timing:  constant. Duration:  two weeks. Context:  stressors.  Past Medical History:  Past Medical History  Diagnosis Date  . Depression   . PTSD (post-traumatic stress disorder)   . Social anxiety disorder   . Seizures   . Adrenal insufficiency   . Hyperlipidemia     Past Surgical History  Procedure Laterality Date  . Ear  tube removal     Family History: History reviewed. No pertinent family history. Social History:  History  Alcohol Use No     History  Drug Use No    History   Social History  . Marital Status: Divorced    Spouse Name: N/A  . Number of Children: N/A  . Years of Education: N/A   Social History Main Topics  . Smoking status: Current Every Day Smoker  . Smokeless tobacco: Not on file  . Alcohol Use: No  . Drug Use: No  . Sexual Activity: Not on file   Other Topics Concern  . None   Social History Narrative   Additional Social History:                          Allergies:  No Known Allergies  Labs:  No results found for this or any previous visit (from the past 48 hour(s)).  Vitals: Blood pressure 116/63, pulse 96, temperature 97.6 F (36.4 C), temperature source Oral, resp. rate 17, SpO2 100 %.  Risk to Self: Is patient at risk for suicide?: Yes Risk to Others:   Prior Inpatient Therapy:   Prior Outpatient Therapy:    Current Facility-Administered Medications  Medication Dose Route Frequency Provider Last Rate Last Dose  . acetaminophen (TYLENOL) tablet 650 mg  650 mg Oral Q6H PRN Worthy FlankIjeoma E Nwaeze, NP   650 mg at 10/30/14 0041  . benztropine (COGENTIN) tablet 0.5  mg  0.5 mg Oral BID Roya Gieselman   0.5 mg at 10/31/14 1106  . clonazePAM (KLONOPIN) tablet 0.5 mg  0.5 mg Oral BID Georgiann Neider   0.5 mg at 10/31/14 1627  . DULoxetine (CYMBALTA) DR capsule 60 mg  60 mg Oral Daily Worthy Flank, NP   60 mg at 10/31/14 0847  . fenofibrate tablet 54 mg  54 mg Oral Daily Worthy Flank, NP   54 mg at 10/31/14 0847  . fluticasone (FLONASE) 50 MCG/ACT nasal spray 2 spray  2 spray Each Nare Daily Worthy Flank, NP   2 spray at 10/31/14 0849  . hydrocortisone (CORTEF) tablet 30 mg  30 mg Oral BID Worthy Flank, NP   30 mg at 10/31/14 0849  . hydrocortisone cream 1 %   Topical TID Charm Rings, NP      . hydrOXYzine (ATARAX/VISTARIL) tablet 100 mg  100 mg  Oral QHS Denay Pleitez      . ibuprofen (ADVIL,MOTRIN) tablet 800 mg  800 mg Oral Q6H PRN Worthy Flank, NP      . levothyroxine (SYNTHROID, LEVOTHROID) tablet 100 mcg  100 mcg Oral QAC breakfast Worthy Flank, NP   100 mcg at 10/31/14 0846  . nicotine (NICODERM CQ - dosed in mg/24 hours) patch 21 mg  21 mg Transdermal Daily Charm Rings, NP   21 mg at 10/31/14 0900  . omega-3 acid ethyl esters (LOVAZA) capsule 2 g  2 g Oral BID Worthy Flank, NP   2 g at 10/31/14 0848  . PHENobarbital (LUMINAL) tablet 97.2 mg  97.2 mg Oral TID Worthy Flank, NP   97.2 mg at 10/31/14 1628  . risperiDONE (RISPERDAL) tablet 3 mg  3 mg Oral QHS Quanisha Drewry   3 mg at 10/30/14 2101   Current Outpatient Prescriptions  Medication Sig Dispense Refill  . fluticasone (FLONASE) 50 MCG/ACT nasal spray Place 2 sprays into both nostrils daily.    Marland Kitchen alprazolam (XANAX) 2 MG tablet Take 2 mg by mouth 2 (two) times daily.    . DULoxetine (CYMBALTA) 60 MG capsule Take 60 mg by mouth daily.    . fenofibrate 54 MG tablet Take 54 mg by mouth daily.    . hydrocortisone (CORTEF) 20 MG tablet Take 30 mg by mouth 2 (two) times daily.     Marland Kitchen levothyroxine (SYNTHROID, LEVOTHROID) 100 MCG tablet Take 100 mcg by mouth daily before breakfast.    . omega-3 acid ethyl esters (LOVAZA) 1 G capsule Take 2 g by mouth 2 (two) times daily.    Marland Kitchen PHENobarbital (LUMINAL) 97.2 MG tablet Take 97.2 mg by mouth 3 (three) times daily.    . QUEtiapine (SEROQUEL) 300 MG tablet Take 300 mg by mouth at bedtime.    . risperidone (RISPERDAL) 4 MG tablet Take 4 mg by mouth at bedtime.      Musculoskeletal: Strength & Muscle Tone: within normal limits Gait & Station: normal Patient leans: N/A  Psychiatric Specialty Exam:     Blood pressure 116/63, pulse 96, temperature 97.6 F (36.4 C), temperature source Oral, resp. rate 17, SpO2 100 %.There is no weight on file to calculate BMI.  General Appearance: Disheveled  Eye Contact::  Minimal   Speech:  Normal Rate  Volume:  Normal  Mood:  Irritable  Affect:  Blunt  Thought Process:  Coherent  Orientation:  Full (Time, Place, and Person)  Thought Content:  Hallucinations: Auditory Command:  kill himself  Suicidal Thoughts:  Yes.  with intent/plan  Homicidal Thoughts:  No  Memory:  Immediate;   Fair Recent;   Fair Remote;   Fair  Judgement:  Impaired  Insight:  Fair  Psychomotor Activity:  Decreased  Concentration:  Fair  Recall:  [employer] of Knowledge:Fair  Language: Good  Akathisia:  No  Handed:  Right  AIMS (if indicated):     Assets:  Leisure Time Physical Health Resilience  ADL's:  Intact  Cognition: WNL  Sleep:      Medical Decision Making: Review of Psycho-Social Stressors (1), Review or order clinical lab tests (1) and Review of Medication Regimen & Side Effects (2)  Treatment Plan Summary: Daily contact with patient to assess and evaluate symptoms and progress in treatment, Medication management and Plan Admit to inpatient psychiatry for stabilization  Plan:  Recommend psychiatric Inpatient admission when medically cleared. Disposition: Admit to inpatient psychiatry for stabilization  Nanine Means, PMH-NP 10/31/2014 5:30 PM Patient seen face-to-face for psychiatric evaluation, chart reviewed and case discussed with the physician extender and developed treatment plan. Reviewed the information documented and agree with the treatment plan. Thedore Mins, MD

## 2014-10-31 NOTE — ED Notes (Addendum)
Pt continues to remain on seizure precautions. He is easily awakened and was steady on his feet when going to the bathroom. Pt contracts for safety at this time and has good eye contact. Pt stated,"my nerves are shot and I need xanax," MD and NP made aware that pt is escalating. Pt will receive some Geodon. Pt keeps coming to the nurses station and keeps inquiring about his medications. Pt is insistant that we are trying to make him withdraw and than he must be on xanax 4mg  a day. Pt was informed this was not ordered for him.2:40pm-Pt is asleep now. Resp regular and nonlabored. Pt is high acuity (5pm)

## 2014-10-31 NOTE — ED Notes (Signed)
Acuity high, pt IVC. Pt sleeping at this time. 

## 2014-11-01 ENCOUNTER — Emergency Department (HOSPITAL_COMMUNITY): Payer: Self-pay

## 2014-11-01 MED ORDER — RISPERIDONE 1 MG PO TABS
1.0000 mg | ORAL_TABLET | Freq: Every day | ORAL | Status: DC
  Administered 2014-11-02: 1 mg via ORAL
  Filled 2014-11-01: qty 1

## 2014-11-01 MED ORDER — DIPHENHYDRAMINE HCL 25 MG PO CAPS
25.0000 mg | ORAL_CAPSULE | Freq: Once | ORAL | Status: AC
  Administered 2014-11-01: 25 mg via ORAL
  Filled 2014-11-01: qty 1

## 2014-11-01 MED ORDER — OXYCODONE-ACETAMINOPHEN 5-325 MG PO TABS
1.0000 | ORAL_TABLET | Freq: Once | ORAL | Status: AC
  Administered 2014-11-01: 1 via ORAL
  Filled 2014-11-01: qty 1

## 2014-11-01 MED ORDER — METOCLOPRAMIDE HCL 10 MG PO TABS
10.0000 mg | ORAL_TABLET | Freq: Once | ORAL | Status: AC
  Administered 2014-11-01: 10 mg via ORAL
  Filled 2014-11-01: qty 1

## 2014-11-01 NOTE — ED Provider Notes (Signed)
Pt given reglan, benadyl and percocet PO of h/a not relieved by ibuprofen  [name], MD 11/02/14 0101

## 2014-11-01 NOTE — Progress Notes (Signed)
CM spoke with pt who [place]. CM discussed and provided written information for self pay pcps, importance of pcp for f/u care, www.needymeds.org, www.goodrx.com, discounted pharmacies and other [place] resources such as [facility], [facility], affordable care act,  financial assistance, DSS and  health department  Reviewed resources for [place] self pay pcps like [facility], [facility] medicine at [address], [facility], general medical clinics, [facility] urgent [facility] plus others, medication resources, [facility] out patient pharmacies and housing Pt voiced understanding and appreciation of resources provided   Provided [facility] contact information   CARE MANAGEMENT ED NOTE 11/01/2014  Patient:  Mark BeckmannHITT,Mark [name]   Account Number:  [id number]  Date Initiated:  11/01/2014  Documentation initiated by:  Edd ArbourGIBBS,KIMBERLY  Subjective/Objective Assessment:   34 yr [place] [facility] [place] last week after being admitted for hearing voices. While there, he was on Risperdal which helped with his voices. Pt sts the meds are no longer keeping him     Subjective/Objective Assessment Detail:   from hearing the voices. [facility] and they refused to admit him. Pt became angry and punched a wall    no pcp as confirmed by pt reports he recently moved to [place]    dx Severe recurrent major depression with psychotic features  Provided pt with a list goodrx cost list for   [facility] 97.2 mg [name] for $10.59 with discount coupon  Risperdal 3 mg 30 tabs at for [facility] at [facility] with coupon  trazodone 100 mg 30 tabs [facility] at [facility]  30 tablets of levothyroxine [facility]mcg at [facility] for $4     Action/Plan:   spoke with pt about [place] self pay resources see note below Discussed with pt that there is not a program to offer free medications as he inquired about CM encouragd use of goodrx,  needymeds and [facility] (starred on info given   Action/Plan Detail:   Pt given good rx coupons for above mentioned phenobarbital, risperdal   Anticipated DC Date:       Status Recommendation to Physician:   Result of Recommendation:    Other ED Services  Consult Working Psychologist, educationallan    DC Planning Services  Outpatient Services - Pt will follow up  Other  PCP issues    Choice offered to / List presented to:            Status of service:  Completed, signed off  ED Comments:   ED Comments Detail:  CM spoke with pt who confirms [place]. CM discussed and provided written information for self pay pcps, importance of pcp for f/u care, www.needymeds.org, www.goodrx.com, discounted pharmacies and other [place] resources such as [facility], [employer], affordable care act,  financial assistance, DSS and  health department  Reviewed resources for [place] self pay pcps like [facility], family medicine at [address], [facility], general medical clinics, [facility] urgent care plus others, medication resources, [facility] out patient pharmacies and housing Pt voiced understanding and appreciation of resources provided  Provided [facility] contact information Pt not eligible has not been a [place] resident for more than 3 months per pt

## 2014-11-01 NOTE — BH Assessment (Signed)
[facility] Assessment Progress Note  The following facilities have been contacted in an effort to place this patient, with results as noted:  Beds available, information faxed, decision pending:  [facility]  [facility] available:  [facility]  At capacity:  [facility]  Crucita Lacorte, [place] Triage Specialist 11/01/2014 @ 16:17

## 2014-11-01 NOTE — ED Notes (Signed)
Pt with multiple somatic complaints as well as c/o migraine. Pt hyper verbal and tangential about his medication changes. Pt states he is anxious and wants to go ahead and [facility]. Pt asking for multiple snacks and attempting to staff-split to get more items. Pt at nursing station talking at length about his complaints. RN encouraged pt to rest in bed so the headache will decrease.

## 2014-11-01 NOTE — ED Notes (Signed)
Pt c/o right hand pain. States he hit it recently and has been taking prns but they are not helping. Discussed with MD, orders received. Will continue current POC. Acuity low

## 2014-11-01 NOTE — ED Notes (Signed)
Pt resting quietly in bed with eyes closed. Respirations are even and unlabored. No acute distress noted. Safety has been maintained with q15 minute observation. Will continue current POC. Acuity low 

## 2014-11-01 NOTE — ED Notes (Signed)
Patient seen awake watching TV. Complained of right hand/finger pain. Ibuprofen given po prn as prescribed. Accepted all his due medications. Endorses SI with no plan. Patient stated "I'm not gonna do any shit here. Will definitely not hurt myself" Denies AH/VH at this time. No behavioral issues noted. Will continue to monitor patient for safety and stability.

## 2014-11-01 NOTE — Consult Note (Signed)
[facility]   Reason for Consult:  Suicidal ideations Referring Physician:  EDP Patient Identification: Mark BeckmannRandy S [name] MRN:  [id number] Principal Diagnosis: Severe recurrent major depression with psychotic features Diagnosis:   Patient Active Problem List   Diagnosis Date Noted  . Severe recurrent major depression with psychotic features [F33.3] 10/30/2014  . Suicidal ideations [R45.851] 10/30/2014    Total Time spent with patient: 45 minutes  Subjective:   Mark BeckmannRandy S [name] is a 34 y.o. male patient admitted with suicidal ideations and a plan to overdose, hallucinations.  HPI:  The patient appears to be malingering with frequent requests for Xanax, Seroquel.  He initially reported taking Xanax 2 mg QID, then changed it to BID.  Yesterday, it was changed to Klonopin and he has had no withdrawal type of symptoms.  He reports being so anxious that he can't leave his room but left his room and went to the dayroom, shortly after his assessment and this statement.  His vital signs have been stable with no increases.  He continues to say he hears demonic voices but does not appear to be responding to internal stimuli.  MD educated him on the use of long term benzodiazepines and how his goal is to taper him off of these medications while starting a SSRI since "9 years" of the use of Xanax has not decreased his anxiety.  Patient still continues to demand Xanax and Seroquel.  Seroquel discontinued due to his concurrent use of Risperdal.  11/01/2014 Mark Mark RisingHitt still maintains he is suicidal.  Says he is worse than when he came in.  "Hearing voices non stop.  I'm going to kill myself in fifty different ways".  He was irritated, covered his face and was dismissive of the questions we asked indicating he was just here for a bed.He denied any drugs or alcohol. HPI Elements:   Location:  generalized. Quality:  acute. Severity:  severe. Timing:  constant. Duration:  two weeks. Context:   stressors.  Past Medical History:  Past Medical History  Diagnosis Date  . Depression   . PTSD (post-traumatic stress disorder)   . Social anxiety disorder   . Seizures   . Adrenal insufficiency   . Hyperlipidemia     Past Surgical History  Procedure Laterality Date  . Ear tube removal     Family History: History reviewed. No pertinent family history. Social History:  History  Alcohol Use No     History  Drug Use No    History   Social History  . Marital Status: Divorced    Spouse Name: N/A  . Number of Children: N/A  . Years of Education: N/A   Social History Main Topics  . Smoking status: Current Every Day Smoker  . Smokeless tobacco: Not on file  . Alcohol Use: No  . Drug Use: No  . Sexual Activity: Not on file   Other Topics Concern  . None   Social History Narrative   Additional Social History:                          Allergies:  No Known Allergies  Labs:  No results found for this or any previous visit (from the past 48 hour(s)).  Vitals: Blood pressure 103/62, pulse 71, temperature 98.1 F (36.7 C), temperature source Oral, resp. rate 16, SpO2 96 %.  Risk to Self: Is patient at risk for suicide?: Yes Risk to Others:   Prior Inpatient  Therapy:   Prior Outpatient Therapy:    Current Facility-Administered Medications  Medication Dose Route Frequency Provider Last Rate Last Dose  . acetaminophen (TYLENOL) tablet 650 mg  650 mg Oral Q6H PRN Mark Flank, NP   650 mg at 11/01/14 1240  . benztropine (COGENTIN) tablet 0.5 mg  0.5 mg Oral BID Mark [name]   0.5 mg at 11/01/14 0905  . clonazePAM (KLONOPIN) tablet 0.5 mg  0.5 mg Oral BID Mark [name]   0.5 mg at 11/01/14 0904  . DULoxetine (CYMBALTA) DR capsule 60 mg  60 mg Oral Daily Mark Flank, NP   60 mg at 11/01/14 0903  . fenofibrate tablet 54 mg  54 mg Oral Daily Mark Flank, NP   54 mg at 11/01/14 0905  . fluticasone (FLONASE) 50 MCG/ACT nasal spray 2 spray  2 spray  Each Nare Daily Mark Flank, NP   2 spray at 11/01/14 0905  . hydrocortisone (CORTEF) tablet 30 mg  30 mg Oral BID Mark Flank, NP   30 mg at 11/01/14 0903  . hydrocortisone cream 1 %   Topical TID Mark Rings, NP      . hydrOXYzine (ATARAX/VISTARIL) tablet 100 mg  100 mg Oral QHS Mark [name]   100 mg at 10/31/14 2120  . ibuprofen (ADVIL,MOTRIN) tablet 800 mg  800 mg Oral Q6H PRN Mark Flank, NP   800 mg at 11/01/14 1118  . levothyroxine (SYNTHROID, LEVOTHROID) tablet 100 mcg  100 mcg Oral QAC breakfast Mark Flank, NP   100 mcg at 11/01/14 0905  . nicotine (NICODERM CQ - dosed in mg/24 hours) patch 21 mg  21 mg Transdermal Daily Mark Rings, NP   21 mg at 11/01/14 0905  . omega-3 acid ethyl esters (LOVAZA) capsule 2 g  2 g Oral BID Mark Flank, NP   2 g at 11/01/14 0905  . PHENobarbital (LUMINAL) tablet 97.2 mg  97.2 mg Oral TID Mark Flank, NP   97.2 mg at 11/01/14 0903  . risperiDONE (RISPERDAL) tablet 3 mg  3 mg Oral QHS Mark [name]   3 mg at 10/31/14 2121   Current Outpatient Prescriptions  Medication Sig Dispense Refill  . fluticasone (FLONASE) 50 MCG/ACT nasal spray Place 2 sprays into both nostrils daily.    Marland Kitchen alprazolam (XANAX) 2 MG tablet Take 2 mg by mouth 2 (two) times daily.    . DULoxetine (CYMBALTA) 60 MG capsule Take 60 mg by mouth daily.    . fenofibrate 54 MG tablet Take 54 mg by mouth daily.    . hydrocortisone (CORTEF) 20 MG tablet Take 30 mg by mouth 2 (two) times daily.     Marland Kitchen levothyroxine (SYNTHROID, LEVOTHROID) 100 MCG tablet Take 100 mcg by mouth daily before breakfast.    . omega-3 acid ethyl esters (LOVAZA) 1 G capsule Take 2 g by mouth 2 (two) times daily.    Marland Kitchen PHENobarbital (LUMINAL) 97.2 MG tablet Take 97.2 mg by mouth 3 (three) times daily.    . QUEtiapine (SEROQUEL) 300 MG tablet Take 300 mg by mouth at bedtime.    . risperidone (RISPERDAL) 4 MG tablet Take 4 mg by mouth at bedtime.      Musculoskeletal: Strength & Muscle  Tone: within normal limits Gait & Station: normal Patient leans: N/A  Psychiatric Specialty Exam:     Blood pressure 103/62, pulse 71, temperature 98.1 F (36.7 C), temperature source Oral, resp. rate 16, SpO2 96 %.There  is no weight on file to calculate BMI.  General Appearance: Disheveled  Eye Contact::  Minimal  Speech:  Normal Rate  Volume:  Normal  Mood:  Irritable  Affect:  Blunt  Thought Process:  Coherent  Orientation:  Full (Time, Place, and Person)  Thought Content:  Hallucinations: Auditory Command:  kill himself  Suicidal Thoughts:  Yes.  with intent/plan  Homicidal Thoughts:  No  Memory:  Immediate;   Fair Recent;   Fair Remote;   Fair  Judgement:  Impaired  Insight:  Fair  Psychomotor Activity:  Decreased  Concentration:  Fair  Recall:  [employer] of Knowledge:Fair  Language: Good  Akathisia:  No  Handed:  Right  AIMS (if indicated):     Assets:  Leisure Time Physical Health Resilience  ADL's:  Intact  Cognition: WNL  Sleep:      Medical Decision Making: reviewed pain symptoms, reviewed recent notes, talked to patient and to nursing staff  Treatment Plan Summary: Daily contact with patient to assess and evaluate symptoms and progress in treatment, Medication management and Plan Admit to inpatient psychiatry for stabilization  Plan:  Recommend psychiatric Inpatient admission when medically cleared. Disposition: Admit to inpatient psychiatry for stabilization  Benjaman Pott, PMH-NP 11/01/2014 1:56 PM Patient seen face-to-face for psychiatric evaluation, chart reviewed and case discussed with the physician extender and developed treatment plan. Reviewed the information documented and agree with the treatment plan. Thedore Mins, MD

## 2014-11-01 NOTE — ED Notes (Signed)
Pt resting quietly in bed. PRNs provided for pain with mixed results. MD aware, no new orders received. No acute distress noted. Safety has been maintained with q15 minute observation. Will continue current POC. Acuity low

## 2014-11-02 MED ORDER — OXYCODONE-ACETAMINOPHEN 5-325 MG PO TABS
1.0000 | ORAL_TABLET | Freq: Once | ORAL | Status: AC
  Administered 2014-11-02: 1 via ORAL
  Filled 2014-11-02: qty 1

## 2014-11-02 MED ORDER — ONDANSETRON 4 MG PO TBDP
4.0000 mg | ORAL_TABLET | Freq: Once | ORAL | Status: AC
  Administered 2014-11-02: 4 mg via ORAL
  Filled 2014-11-02 (×3): qty 1

## 2014-11-02 MED ORDER — KETOROLAC TROMETHAMINE 60 MG/2ML IM SOLN
60.0000 mg | Freq: Once | INTRAMUSCULAR | Status: AC
  Administered 2014-11-02: 60 mg via INTRAMUSCULAR
  Filled 2014-11-02: qty 2

## 2014-11-02 NOTE — ED Notes (Signed)
Pt remains high acuity due to IVC, but is currently asleep. No inappropriate behaviors noted. 

## 2014-11-02 NOTE — BH Assessment (Signed)
Dr. Ladona Ridgelaylor and Nanine MeansJamison Lord, DNP recommend inpatient treatment. No [facility] beds available at this time. TTS to seek appropriate placement. Pt referred to [address], [place], [facility], [facility], [facility], [address], [place], [address], [facility], [place], [place], [place], and [facility] [place]. TTS will continue seeking placement.

## 2014-11-02 NOTE — ED Notes (Signed)
Observed in BR looking at hands, then stuffing what was in his hand into left check.  Did not use the toilet.

## 2014-11-02 NOTE — Consult Note (Signed)
[facility]   Reason for Consult:  Suicidal ideations Referring Physician:  EDP Patient Identification: Mark BeckmannRandy S [name] MRN:  [id number] Principal Diagnosis: Severe recurrent major depression with psychotic features Diagnosis:   Patient Active Problem List   Diagnosis Date Noted  . Severe recurrent major depression with psychotic features [F33.3] 10/30/2014  . Suicidal ideations [R45.851] 10/30/2014    Total Time spent with patient: 45 minutes  Subjective:   Mark BeckmannRandy S [name] is a 34 y.o. male patient admitted with suicidal ideations and a plan to overdose, hallucinations.  HPI:  The patient appears to be malingering with frequent requests for Xanax, Seroquel.  He initially reported taking Xanax 2 mg QID, then changed it to BID.  Yesterday, it was changed to Klonopin and he has had no withdrawal type of symptoms.  He reports being so anxious that he can't leave his room but left his room and went to the dayroom, shortly after his assessment and this statement.  His vital signs have been stable with no increases.  He continues to say he hears demonic voices but does not appear to be responding to internal stimuli.  MD educated him on the use of long term benzodiazepines and how his goal is to taper him off of these medications while starting a SSRI since "9 years" of the use of Xanax has not decreased his anxiety.  Patient still continues to demand Xanax and Seroquel.  Seroquel discontinued due to his concurrent use of Risperdal.  11/01/2014 Mark [name] still maintains he is suicidal.  Says he is worse than when he came in.  "Hearing voices non stop.  I'm going to kill myself in fifty different ways".  He was irritated, covered his face and was dismissive of the questions we asked indicating he was just here for a bed.He denied any drugs or alcohol.  11/02/2014:  Mark [name].  Says he is still suicidal.  X-ray of his right hand revealed no current fracture.  He  complains of pain daily and the ED prescribed stronger pain medication for him.  Tends to cover his face and answer minimally when we meet with him. HPI Elements:   Location:  generalized. Quality:  acute. Severity:  severe. Timing:  constant. Duration:  two weeks. Context:  stressors.  Past Medical History:  Past Medical History  Diagnosis Date  . Depression   . PTSD (post-traumatic stress disorder)   . Social anxiety disorder   . Seizures   . Adrenal insufficiency   . Hyperlipidemia     Past Surgical History  Procedure Laterality Date  . Ear tube removal     Family History: History reviewed. No pertinent family history. Social History:  History  Alcohol Use No     History  Drug Use No    History   Social History  . Marital Status: Divorced    Spouse Name: N/A  . Number of Children: N/A  . Years of Education: N/A   Social History Main Topics  . Smoking status: Current Every Day Smoker  . Smokeless tobacco: Not on file  . Alcohol Use: No  . Drug Use: No  . Sexual Activity: Not on file   Other Topics Concern  . None   Social History Narrative   Additional Social History:                          Allergies:  No Known Allergies  Labs:  No results found for this or any previous visit (from the past 48 hour(s)).  Vitals: Blood pressure 99/59, pulse 81, temperature 98.1 F (36.7 C), temperature source Oral, resp. rate 15, SpO2 99 %.  Risk to Self: Is patient at risk for suicide?: Yes Risk to Others:   Prior Inpatient Therapy:   Prior Outpatient Therapy:    Current Facility-Administered Medications  Medication Dose Route Frequency Provider Last Rate Last Dose  . acetaminophen (TYLENOL) tablet 650 mg  650 mg Oral Q6H PRN Worthy Flank, NP   650 mg at 11/01/14 1240  . benztropine (COGENTIN) tablet 0.5 mg  0.5 mg Oral BID Mojeed Akintayo   0.5 mg at 11/02/14 0911  . clonazePAM (KLONOPIN) tablet 0.5 mg  0.5 mg Oral BID Mojeed Akintayo   0.5 mg at  11/02/14 0912  . DULoxetine (CYMBALTA) DR capsule 60 mg  60 mg Oral Daily Worthy Flank, NP   60 mg at 11/02/14 0915  . fenofibrate tablet 54 mg  54 mg Oral Daily Worthy Flank, NP   54 mg at 11/02/14 0915  . fluticasone (FLONASE) 50 MCG/ACT nasal spray 2 spray  2 spray Each Nare Daily Worthy Flank, NP   2 spray at 11/02/14 0914  . hydrocortisone (CORTEF) tablet 30 mg  30 mg Oral BID Worthy Flank, NP   30 mg at 11/02/14 0914  . hydrocortisone cream 1 %   Topical TID Charm Rings, NP      . hydrOXYzine (ATARAX/VISTARIL) tablet 100 mg  100 mg Oral QHS Mojeed Akintayo   100 mg at 11/01/14 2141  . ibuprofen (ADVIL,MOTRIN) tablet 800 mg  800 mg Oral Q6H PRN Worthy Flank, NP   800 mg at 11/01/14 1118  . levothyroxine (SYNTHROID, LEVOTHROID) tablet 100 mcg  100 mcg Oral QAC breakfast Worthy Flank, NP   100 mcg at 11/02/14 0913  . nicotine (NICODERM CQ - dosed in mg/24 hours) patch 21 mg  21 mg Transdermal Daily Charm Rings, NP   21 mg at 11/02/14 0916  . omega-3 acid ethyl esters (LOVAZA) capsule 2 g  2 g Oral BID Worthy Flank, NP   2 g at 11/02/14 0914  . PHENobarbital (LUMINAL) tablet 97.2 mg  97.2 mg Oral TID Worthy Flank, NP   97.2 mg at 11/02/14 0911  . risperiDONE (RISPERDAL) tablet 1 mg  1 mg Oral Daily Earney Navy, NP   1 mg at 11/02/14 0912  . risperiDONE (RISPERDAL) tablet 3 mg  3 mg Oral QHS Mojeed Akintayo   3 mg at 11/01/14 2141   Current Outpatient Prescriptions  Medication Sig Dispense Refill  . fluticasone (FLONASE) 50 MCG/ACT nasal spray Place 2 sprays into both nostrils daily.    Marland Kitchen alprazolam (XANAX) 2 MG tablet Take 2 mg by mouth 2 (two) times daily.    . DULoxetine (CYMBALTA) 60 MG capsule Take 60 mg by mouth daily.    . fenofibrate 54 MG tablet Take 54 mg by mouth daily.    . hydrocortisone (CORTEF) 20 MG tablet Take 30 mg by mouth 2 (two) times daily.     Marland Kitchen levothyroxine (SYNTHROID, LEVOTHROID) 100 MCG tablet Take 100 mcg by mouth daily before  breakfast.    . omega-3 acid ethyl esters (LOVAZA) 1 G capsule Take 2 g by mouth 2 (two) times daily.    Marland Kitchen PHENobarbital (LUMINAL) 97.2 MG tablet Take 97.2 mg by mouth 3 (three) times daily.    Marland Kitchen  QUEtiapine (SEROQUEL) 300 MG tablet Take 300 mg by mouth at bedtime.    . risperidone (RISPERDAL) 4 MG tablet Take 4 mg by mouth at bedtime.      Musculoskeletal: Strength & Muscle Tone: within normal limits Gait & Station: normal Patient leans: N/A  Psychiatric Specialty Exam:     Blood pressure 99/59, pulse 81, temperature 98.1 F (36.7 C), temperature source Oral, resp. rate 15, SpO2 99 %.There is no weight on file to calculate BMI.  General Appearance: Disheveled  Eye Contact::  Minimal  Speech:  Normal Rate  Volume:  Normal  Mood:  Irritable  Affect:  Blunt  Thought Process:  Coherent  Orientation:  Full (Time, Place, and Person)  Thought Content:  Hallucinations: Auditory Command:  kill himself  Suicidal Thoughts:  Yes.  with intent/plan  Homicidal Thoughts:  No  Memory:  Immediate;   Fair Recent;   Fair Remote;   Fair  Judgement:  Impaired  Insight:  Fair  Psychomotor Activity:  Decreased  Concentration:  Fair  Recall:  [employer] of Knowledge:Fair  Language: Good  Akathisia:  No  Handed:  Right  AIMS (if indicated):     Assets:  Leisure Time Physical Health Resilience  ADL's:  Intact  Cognition: WNL  Sleep:      Medical Decision Making: reviewed pain symptoms, reviewed recent notes, talked to patient and to nursing staff  Treatment Plan Summary: Daily contact with patient to assess and evaluate symptoms and progress in treatment, Medication management and Plan Admit to inpatient psychiatry for stabilization  Plan:  Recommend psychiatric Inpatient admission when medically cleared. Disposition: Admit to inpatient psychiatry for stabilization  Benjaman Pott, PMH-NP 11/02/2014 11:54 AM

## 2014-11-02 NOTE — ED Notes (Signed)
Acuity note- Pt is calm and cooperative.  Low acuity.

## 2014-11-02 NOTE — ED Notes (Signed)
Acuity note-  Patient resting and in NAD.  Acuity low.

## 2014-11-02 NOTE — ED Notes (Signed)
Pt resting in bed. Needs assessed. Pt denied. Pt reported having thoughts of hurting himself; no plan. Pt also reported he is experiencing auditory hallucinations. The voices are "like demon sounds" but he is unable to determine what is being said. Pt reports he has recently begun having auditory hallucinations during the past 3 months. Support and encouragement given. Pt receptive.

## 2014-11-02 NOTE — ED Notes (Signed)
Pt remains high acuity due to IVC, but is currently asleep. No inappropriate behaviors noted.

## 2014-11-02 NOTE — ED Notes (Signed)
Acuity note- Pt isolative to room.  Low acuity.

## 2014-11-02 NOTE — ED Notes (Signed)
Pt high acuity due to being IVC and needing frequent care. Pt anxious and somatic.

## 2014-11-03 MED ORDER — LITHIUM CARBONATE ER 450 MG PO TBCR: 450.0000 mg | EXTENDED_RELEASE_TABLET | Freq: Two times a day (BID) | ORAL | Status: DC

## 2014-11-03 MED ORDER — PALIPERIDONE ER 6 MG PO TB24
9.0000 mg | ORAL_TABLET | Freq: Every day | ORAL | Status: DC
Start: 2014-11-03 — End: 2014-11-03
  Filled 2014-11-03: qty 1

## 2014-11-03 NOTE — ED Notes (Signed)
Pt took medications and immediately went to the restroom. Pt observed swallowing medication. Pt appeared to be gagging in the sink. Knocked on door and pt says that he is brushing his teeth. Pt is irritable wanting to leave and wanting prescriptions for klonopin and other medications. MD evaluating. Checked pt's room and found to unidentified half of pills. Pt denies that the pills are his and became defensive when asked about medication.

## 2014-11-03 NOTE — Consult Note (Signed)
[facility] Face-to-Face Psychiatry Consult   Reason for Consult:  Suicidal ideations Referring Physician:  EDP Patient Identification: Mark [name] MRN:  [id number] Principal Diagnosis: Severe recurrent major depression with psychotic features Diagnosis:   Patient Active Problem List   Diagnosis Date Noted  . Severe recurrent major depression with psychotic features [F33.3] 10/30/2014    Priority: High  . Suicidal ideations [R45.851] 10/30/2014    Priority: High    Total Time spent with patient: 30 minutes  Subjective:   Mark [name] is a 34 y.o. male patient has stabilized.  HPI:  The patient requested to leave today as he has stabilized.  He denies suicidal/homicidal ideations, hallucinations, and alcohol/drug abuse.  Mark [name] wants to leave with a bus pass to return to [facility].  He evidently has been storing medications in his room and spitting them out in his room or bathroom (seen today in bathroom).  Mark [name] had appeared to be malingering from the beginning and pain medication/benzodiazepine seeking.  He also appears to be homeless despite stating he has a place to live.  Stable at this time for discharge. HPI Elements:   Location:  generalized. Quality:  acute. Severity:  severe. Timing:  constant. Duration:  two weeks. Context:  stressors.  Past Medical History:  Past Medical History  Diagnosis Date  . Depression   . PTSD (post-traumatic stress disorder)   . Social anxiety disorder   . Seizures   . Adrenal insufficiency   . Hyperlipidemia     Past Surgical History  Procedure Laterality Date  . Ear tube removal     Family History: History reviewed. No pertinent family history. Social History:  History  Alcohol Use No     History  Drug Use No    History   Social History  . Marital Status: Divorced    Spouse Name: N/A  . Number of Children: N/A  . Years of Education: N/A   Social History Main Topics  . Smoking status: Current Every Day Smoker  . Smokeless tobacco:  Not on file  . Alcohol Use: No  . Drug Use: No  . Sexual Activity: Not on file   Other Topics Concern  . None   Social History Narrative   Additional Social History:                          Allergies:  No Known Allergies  Labs:  No results found for this or any previous visit (from the past 48 hour(s)).  Vitals: Blood pressure 103/50, pulse 60, temperature 98.1 F (36.7 C), temperature source Oral, resp. rate 18, SpO2 98 %.  Risk to Self: Is patient at risk for suicide?: Yes Risk to Others:   Prior Inpatient Therapy:   Prior Outpatient Therapy:    Current Facility-Administered Medications  Medication Dose Route Frequency Provider Last Rate Last Dose  . acetaminophen (TYLENOL) tablet 650 mg  650 mg Oral Q6H PRN Worthy Flank, NP   650 mg at 11/01/14 1240  . benztropine (COGENTIN) tablet 0.5 mg  0.5 mg Oral BID Nicolas Banh   0.5 mg at 11/03/14 1104  . clonazePAM (KLONOPIN) tablet 0.5 mg  0.5 mg Oral BID Jevaun Strick   0.5 mg at 11/03/14 1108  . DULoxetine (CYMBALTA) DR capsule 60 mg  60 mg Oral Daily Worthy Flank, NP   60 mg at 11/03/14 1103  . fenofibrate tablet 54 mg  54 mg Oral Daily Worthy Flank, NP  54 mg at 11/03/14 1107  . fluticasone (FLONASE) 50 MCG/ACT nasal spray 2 spray  2 spray Each Nare Daily Worthy Flank, NP   2 spray at 11/03/14 1059  . hydrocortisone (CORTEF) tablet 30 mg  30 mg Oral BID Worthy Flank, NP   30 mg at 11/03/14 1105  . hydrocortisone cream 1 %   Topical TID Charm Rings, NP      . hydrOXYzine (ATARAX/VISTARIL) tablet 100 mg  100 mg Oral QHS Jatoria Kneeland   100 mg at 11/02/14 2130  . ibuprofen (ADVIL,MOTRIN) tablet 800 mg  800 mg Oral Q6H PRN Worthy Flank, NP   800 mg at 11/01/14 1118  . levothyroxine (SYNTHROID, LEVOTHROID) tablet 100 mcg  100 mcg Oral QAC breakfast Worthy Flank, NP   100 mcg at 11/03/14 0749  . nicotine (NICODERM CQ - dosed in mg/24 hours) patch 21 mg  21 mg Transdermal Daily Charm Rings, NP   21 mg at 11/03/14 1100  . omega-3 acid ethyl esters (LOVAZA) capsule 2 g  2 g Oral BID Worthy Flank, NP   2 g at 11/03/14 1102  . paliperidone (INVEGA) 24 hr tablet 9 mg  9 mg Oral Daily Alida Greiner   9 mg at 11/03/14 1132  . PHENobarbital (LUMINAL) tablet 97.2 mg  97.2 mg Oral TID Worthy Flank, NP   97.2 mg at 11/03/14 1106   Current Outpatient Prescriptions  Medication Sig Dispense Refill  . fluticasone (FLONASE) 50 MCG/ACT nasal spray Place 2 sprays into both nostrils daily.    Marland Kitchen alprazolam (XANAX) 2 MG tablet Take 2 mg by mouth 2 (two) times daily.    . DULoxetine (CYMBALTA) 60 MG capsule Take 60 mg by mouth daily.    . fenofibrate 54 MG tablet Take 54 mg by mouth daily.    . hydrocortisone (CORTEF) 20 MG tablet Take 30 mg by mouth 2 (two) times daily.     Marland Kitchen levothyroxine (SYNTHROID, LEVOTHROID) 100 MCG tablet Take 100 mcg by mouth daily before breakfast.    . omega-3 acid ethyl esters (LOVAZA) 1 G capsule Take 2 g by mouth 2 (two) times daily.    Marland Kitchen PHENobarbital (LUMINAL) 97.2 MG tablet Take 97.2 mg by mouth 3 (three) times daily.    . QUEtiapine (SEROQUEL) 300 MG tablet Take 300 mg by mouth at bedtime.    . risperidone (RISPERDAL) 4 MG tablet Take 4 mg by mouth at bedtime.     Musculoskeletal: Strength & Muscle Tone: within normal limits Gait & Station: normal Patient leans: N/A  Psychiatric Specialty Exam:     Blood pressure 103/50, pulse 60, temperature 98.1 F (36.7 C), temperature source Oral, resp. rate 18, SpO2 98 %.There is no weight on file to calculate BMI.  General Appearance: Casual  Eye Contact::  Good  Speech:  Normal Rate409  Volume:  Normal  Mood:  Anxious  Affect:  Blunt  Thought Process:  Coherent  Orientation:  Full (Time, Place, and Person)  Thought Content:  WDL  Suicidal Thoughts:  No  Homicidal Thoughts:  No  Memory:  Immediate;   Good Recent;   Good Remote;   Good  Judgement:  Fair  Insight:  Fair  Psychomotor Activity:   Normal  Concentration:  Good  Recall:  Good  Fund of Knowledge:Good  Language: Good  Akathisia:  No  Handed:  Right  AIMS (if indicated):     Assets:  Housing Leisure Time Physical Health  Resilience Social Support  Sleep:     Cognition: WNL  ADL's:  Intact   Medical Decision Making: Review of Psycho-Social Stressors (1), Review or order clinical lab tests (1) and Review of Medication Regimen & Side Effects (2)  Treatment Plan Summary:  Plan:  Discharge with bus pass Disposition:  Discharge with follow-up with [facility]  Nanine MeansLORD, JAMISON, PMH-NP 11/03/2014 11:36 AM Patient seen face-to-face for psychiatric evaluation, chart reviewed and case discussed with the physician extender and developed treatment plan. Reviewed the information documented and agree with the treatment plan. Thedore MinsMojeed Keigan Tafoya, MD

## 2014-11-03 NOTE — ED Notes (Signed)
Pt making multiple requests coffee, shower, and talk to MD. Pt is irritable wanting number for complaints and has talked with the MD and social worker multiple times. Pt given bus pass and money for transportation. D/C papers given to pt. All items returned.

## 2014-11-03 NOTE — [facility] Suicide Risk Assessment (Signed)
Suicide Risk Assessment  Discharge Assessment   [facility]   Demographic Factors:  Male, Caucasian and Living alone  Total Time spent with patient: 45 minutes  Musculoskeletal: Strength & Muscle Tone: within normal limits Gait & Station: normal Patient leans: N/A  Psychiatric Specialty Exam:     Blood pressure 103/50, pulse 60, temperature 98.1 F (36.7 C), temperature source Oral, resp. rate 18, SpO2 98 %.There is no weight on file to calculate BMI.  General Appearance: Casual  Eye Contact::  Good  Speech:  Normal Rate409  Volume:  Normal  Mood:  Anxious  Affect:  Blunt  Thought Process:  Coherent  Orientation:  Full (Time, Place, and Person)  Thought Content:  WDL  Suicidal Thoughts:  No  Homicidal Thoughts:  No  Memory:  Immediate;   Good Recent;   Good Remote;   Good  Judgement:  Fair  Insight:  Fair  Psychomotor Activity:  Normal  Concentration:  Good  Recall:  Good  Fund of Knowledge:Good  Language: Good  Akathisia:  No  Handed:  Right  AIMS (if indicated):     Assets:  Housing Leisure Time Physical Health Resilience Social Support  Sleep:     Cognition: WNL  ADL's:  Intact      Has this patient used any form of tobacco in the last 30 days? (Cigarettes, Smokeless Tobacco, Cigars, and/or Pipes) Yes, A prescription for an FDA-approved tobacco cessation medication was offered at discharge and the patient refused  Mental Status Per Nursing Assessment::   On Admission:   Hallucinations, suicidal/homicidal ideations  Current Mental Status by Physician: NA  Loss Factors: NA  Historical Factors: NA  Risk Reduction Factors:   Sense of responsibility to family and Positive therapeutic relationship  Continued Clinical Symptoms:  Some anxiety  Cognitive Features That Contribute To Risk:  None    Suicide Risk:  Minimal: No identifiable suicidal ideation.  Patients presenting with no risk factors but with morbid  ruminations; may be classified as minimal risk based on the severity of the depressive symptoms  Principal Problem: Severe recurrent major depression with psychotic features Discharge Diagnoses:  Patient Active Problem List   Diagnosis Date Noted  . Severe recurrent major depression with psychotic features [F33.3] 10/30/2014    Priority: High  . Suicidal ideations [R45.851] 10/30/2014    Priority: High      Plan Of Care/Follow-up recommendations:  Activity:  as tolerated Diet:  heart healthy diet  Is patient on multiple antipsychotic therapies at discharge:  No   Has Patient had three or more failed trials of antipsychotic monotherapy by history:  No  Recommended Plan for Multiple Antipsychotic Therapies: NA    LORD, JAMISON, PMH-NP 11/03/2014, 11:31 AM

## 2014-11-03 NOTE — ED Notes (Signed)
D:Pt is irritable in his room requesting that the door be shut. Pt rates a headache as a 7 on 1-10 scale. Offered prn medication and pt refused saying it won't help. Pt cursed morning breakfast saying that he always gets eggs and does not like eggs. Offered cereal and pt refused. Pt reports auditory hallucinations and si thoughts. He will not elaborate or say a specific plan. A:Offered support and 15 minute checks. R:Safety maintained on the unit.

## 2014-11-03 NOTE — Progress Notes (Signed)
Pt psychiatrically stable for discharge home per psychiatrist. Patient recommended to follow up with [facility]. Patient states he is familiar with provider and was there last Friday for Crisis. Patient provided with bus pass, and money for part pass.   Olga CoasterKristen Jaylynne Birkhead, LCSW  Clinical Social Work  [facility] [phone]

## 2014-11-13 ENCOUNTER — Emergency Department (HOSPITAL_COMMUNITY)
Admission: EM | Admit: 2014-11-13 | Discharge: 2014-11-15 | Disposition: A | Discharge status: Home / Self Care | Payer: Federal, State, Local not specified - Other | Attending: Emergency Medicine | Admitting: Emergency Medicine

## 2014-11-13 ENCOUNTER — Encounter (HOSPITAL_COMMUNITY): Payer: Self-pay | Admitting: Emergency Medicine

## 2014-11-13 DIAGNOSIS — F1310 Sedative, hypnotic or anxiolytic abuse, uncomplicated: Secondary | ICD-10-CM | POA: Insufficient documentation

## 2014-11-13 DIAGNOSIS — F1210 Cannabis abuse, uncomplicated: Secondary | ICD-10-CM | POA: Insufficient documentation

## 2014-11-13 DIAGNOSIS — F1910 Other psychoactive substance abuse, uncomplicated: Secondary | ICD-10-CM | POA: Diagnosis present

## 2014-11-13 DIAGNOSIS — R45851 Suicidal ideations: Secondary | ICD-10-CM

## 2014-11-13 HISTORY — DX: Disorder of thyroid, unspecified: E07.9

## 2014-11-13 LAB — SALICYLATE LEVEL

## 2014-11-13 LAB — COMPREHENSIVE METABOLIC PANEL
ALK PHOS: 87 U/L (ref 39–117)
ALT: 23 U/L (ref 0–53)
AST: 21 U/L (ref 0–37)
Albumin: 4.5 g/dL (ref 3.5–5.2)
Anion gap: 10 (ref 5–15)
BILIRUBIN TOTAL: 0.4 mg/dL (ref 0.3–1.2)
BUN: 8 mg/dL (ref 6–23)
CHLORIDE: 103 mmol/L (ref 96–112)
CO2: 25 mmol/L (ref 19–32)
CREATININE: 0.86 mg/dL (ref 0.50–1.35)
Calcium: 9.1 mg/dL (ref 8.4–10.5)
GLUCOSE: 97 mg/dL (ref 70–99)
POTASSIUM: 4.4 mmol/L (ref 3.5–5.1)
Sodium: 138 mmol/L (ref 135–145)
TOTAL PROTEIN: 7.5 g/dL (ref 6.0–8.3)

## 2014-11-13 LAB — CBC
HCT: 45.5 % (ref 39.0–52.0)
HEMOGLOBIN: 15.3 g/dL (ref 13.0–17.0)
MCH: 30.7 pg (ref 26.0–34.0)
MCHC: 33.6 g/dL (ref 30.0–36.0)
MCV: 91.4 fL (ref 78.0–100.0)
Platelets: 284 10*3/uL (ref 150–400)
RBC: 4.98 MIL/uL (ref 4.22–5.81)
RDW: 13.7 % (ref 11.5–15.5)
WBC: 7.7 10*3/uL (ref 4.0–10.5)

## 2014-11-13 LAB — ACETAMINOPHEN LEVEL: Acetaminophen (Tylenol), Serum: <10 ug/mL — ABNORMAL LOW (ref 10–30)

## 2014-11-13 LAB — RAPID URINE DRUG SCREEN, HOSP PERFORMED
Amphetamines: NOT DETECTED
BENZODIAZEPINES: NOT DETECTED
Barbiturates: POSITIVE — AB
COCAINE: NOT DETECTED
Opiates: NOT DETECTED
Tetrahydrocannabinol: POSITIVE — AB

## 2014-11-13 LAB — ETHANOL

## 2014-11-13 MED ORDER — RISPERIDONE 2 MG PO TABS
4.0000 mg | ORAL_TABLET | Freq: Every day | ORAL | Status: DC
  Administered 2014-11-13 – 2014-11-14 (×2): 4 mg via ORAL
  Filled 2014-11-13 (×2): qty 2

## 2014-11-13 MED ORDER — LORAZEPAM 1 MG PO TABS: 1.0000 mg | ORAL_TABLET | Freq: Three times a day (TID) | ORAL | Status: DC | PRN

## 2014-11-13 MED ORDER — LORAZEPAM 1 MG PO TABS: 0.0000 mg | ORAL_TABLET | Freq: Four times a day (QID) | ORAL | Status: DC

## 2014-11-13 MED ORDER — THIAMINE HCL 100 MG/ML IJ SOLN: 100.0000 mg | Freq: Every day | INTRAMUSCULAR | Status: DC

## 2014-11-13 MED ORDER — IBUPROFEN 200 MG PO TABS
600.0000 mg | ORAL_TABLET | Freq: Three times a day (TID) | ORAL | Status: DC | PRN
  Administered 2014-11-13 – 2014-11-14 (×3): 600 mg via ORAL
  Filled 2014-11-13 (×3): qty 3

## 2014-11-13 MED ORDER — LORAZEPAM 1 MG PO TABS: 0.0000 mg | ORAL_TABLET | Freq: Two times a day (BID) | ORAL | Status: DC

## 2014-11-13 MED ORDER — LEVOTHYROXINE SODIUM 100 MCG PO TABS
100.0000 ug | ORAL_TABLET | Freq: Every day | ORAL | Status: DC
  Administered 2014-11-14: 100 ug via ORAL
  Filled 2014-11-13 (×3): qty 1

## 2014-11-13 MED ORDER — HYDROCORTISONE 10 MG PO TABS
30.0000 mg | ORAL_TABLET | Freq: Two times a day (BID) | ORAL | Status: DC
  Administered 2014-11-14 (×2): 30 mg via ORAL
  Filled 2014-11-13 (×4): qty 1

## 2014-11-13 MED ORDER — LEVOTHYROXINE SODIUM 100 MCG PO TABS
100.0000 ug | ORAL_TABLET | Freq: Every day | ORAL | Status: DC
  Filled 2014-11-13: qty 1

## 2014-11-13 MED ORDER — DULOXETINE HCL 60 MG PO CPEP
60.0000 mg | ORAL_CAPSULE | Freq: Every day | ORAL | Status: DC
  Administered 2014-11-14: 60 mg via ORAL
  Filled 2014-11-13 (×2): qty 1

## 2014-11-13 MED ORDER — QUETIAPINE FUMARATE 300 MG PO TABS: 300.0000 mg | ORAL_TABLET | Freq: Every day | ORAL | Status: DC

## 2014-11-13 MED ORDER — ONDANSETRON HCL 4 MG PO TABS
4.0000 mg | ORAL_TABLET | Freq: Three times a day (TID) | ORAL | Status: DC | PRN
  Administered 2014-11-13 – 2014-11-14 (×2): 4 mg via ORAL
  Filled 2014-11-13 (×2): qty 1

## 2014-11-13 MED ORDER — VITAMIN B-1 100 MG PO TABS: 100.0000 mg | ORAL_TABLET | Freq: Every day | ORAL | Status: DC

## 2014-11-13 NOTE — ED Notes (Signed)
Per EMS, pt detoxing off benzos. Was seen at [facility] last night for taking "90 benzos." and was discharged from there this morning. Per EMS pt stated the day before yesterday he took 90 phenobarbital and yesterday took 90 xanax. Pt awake, alert, EMS states he won't answer most of their questions but stated he was having suicidal ideation.

## 2014-11-13 NOTE — BH Assessment (Signed)
Assessment Note  Mark [name] is an 34 y.o. male. Patient was recently discharged from [facility] on 3/30.  According to documentation patient was discharged this morning from [facility] after an overnight stay with a chief compliant of overdosing.  Patient reports yesterday overdosing twice first time 90 phenibarbital and the second 90 Xanax. Patient reports he was discharged from [facility] because they had no beds available.  Patient reports currently experiencing auditory hallucinations of demonic voices with no commands. Patient is currently request detox from alcohol and benzo. He reports drinking a fifth of rum daily for the past 20 years.    CSW consulted with Catha Nottingham, NP it is recommended to re-evaluate in the AM.   Axis I: Mood Disorder NOS Axis II: Deferred Axis III:  Past Medical History  Diagnosis Date  . Depression   . PTSD (post-traumatic stress disorder)   . Social anxiety disorder   . Seizures   . Adrenal insufficiency   . Hyperlipidemia   . Thyroid disease    Axis IV: economic problems, housing problems, occupational problems, problems related to social environment, problems with access to health care services and problems with primary support group Axis V: 41-50 serious symptoms  Past Medical History:  Past Medical History  Diagnosis Date  . Depression   . PTSD (post-traumatic stress disorder)   . Social anxiety disorder   . Seizures   . Adrenal insufficiency   . Hyperlipidemia   . Thyroid disease     Past Surgical History  Procedure Laterality Date  . Ear tube removal      Family History: History reviewed. No pertinent family history.  Social History:  reports that he has been smoking.  He does not have any smokeless tobacco history on file. He reports that he does not drink alcohol or use illicit drugs.  Additional Social History:     CIWA: CIWA-Ar BP: 112/58 mmHg Pulse Rate: 71 Nausea and Vomiting: 3 Tactile Disturbances:  none Tremor: not visible, but can be felt fingertip to fingertip Auditory Disturbances: mild harshness or ability to frighten Paroxysmal Sweats: no sweat visible Visual Disturbances: mild sensitivity Anxiety: mildly anxious Headache, Fullness in Head: moderately severe Agitation: normal activity Orientation and Clouding of Sensorium: cannot do serial additions or is uncertain about date CIWA-Ar Total: 14 COWS:    Allergies: No Known Allergies  Home Medications:  (Not in a hospital admission)  OB/GYN Status:  No LMP for male patient.  General Assessment Data Location of Assessment: [facility] ED ACT Assessment: Yes Is this a Tele or Face-to-Face Assessment?: Face-to-Face Is this an Initial Assessment or a Re-assessment for this encounter?: Initial Assessment Living Arrangements: Other (Comment) Can pt return to current living arrangement?: Yes Admission Status: Voluntary Is patient capable of signing voluntary admission?: Yes Transfer from: Home Referral Source: Self/Family/Friend  Medical Screening Exam [facility] Walk-in ONLY) Medical Exam completed: Yes  [facility] Plan Living Arrangements: Other (Comment) Name of Psychiatrist: [facility] Name of Therapist: [facility]  Education Status Is patient currently in school?: No Highest grade of school patient has completed: Some college  Risk to self with the past 6 months Suicidal Ideation: Yes-Currently Present Suicidal Intent: Yes-Currently Present Is patient at risk for suicide?: Yes Suicidal Plan?: Yes-Currently Present Specify Current Suicidal Plan: overdosed Access to Means: Yes Specify Access to Suicidal Means: overdose What has been your use of drugs/alcohol within the last 12 months?: alcohol, benzo Previous Attempts/Gestures: Yes How many times?: 2 Triggers for Past Attempts: Unknown  Intentional Self Injurious Behavior: None Family Suicide History: No Recent stressful life event(s): Loss (Comment), Financial  Problems Persecutory voices/beliefs?: No Depression: Yes Depression Symptoms: Feeling worthless/self pity, Loss of interest in usual pleasures, Feeling angry/irritable Substance abuse history and/or treatment for substance abuse?: Yes  Risk to Others within the past 6 months Homicidal Ideation: No-Not Currently/Within Last 6 Months Thoughts of Harm to Others: No-Not Currently Present/Within Last 6 Months Current Homicidal Intent: No-Not Currently/Within Last 6 Months Current Homicidal Plan: No-Not Currently/Within Last 6 Months Access to Homicidal Means: No History of harm to others?: No Assessment of Violence: None Noted Does patient have access to weapons?: No Criminal Charges Pending?: No Does patient have a court date: No  Psychosis Hallucinations: Auditory (demonic sounds) Delusions: None noted  Mental Status Report Appearance/Hygiene: In hospital gown Eye Contact: Poor Motor Activity: Freedom of movement Speech: Logical/coherent Level of Consciousness: Alert Mood: Depressed, Suspicious Affect: Irritable Anxiety Level: Minimal Thought Processes: Coherent Judgement: Impaired Orientation: Person, Place, Time, Situation, Appropriate for developmental age Obsessive Compulsive Thoughts/Behaviors: None  Cognitive Functioning Concentration: Poor Memory: Recent Intact, Remote Intact IQ: Average Insight: Poor Impulse Control: Poor Appetite: Fair Sleep: Decreased Vegetative Symptoms: None  ADLScreening [facility] Assessment Services) Patient's cognitive ability adequate to safely complete daily activities?: Yes Patient able to express need for assistance with ADLs?: Yes Independently performs ADLs?: Yes (appropriate for developmental age)  Prior Inpatient Therapy Prior Inpatient Therapy: Yes Prior Therapy Dates: 2016 [facility] Reason for Treatment: Depression  Prior Outpatient Therapy Prior Outpatient Therapy: Yes Prior Therapy  Dates: 2016 Prior Therapy Facilty/Provider(s): [facility] Reason for Treatment: Mood Disorder  ADL Screening (condition at time of admission) Patient's cognitive ability adequate to safely complete daily activities?: Yes Patient able to express need for assistance with ADLs?: Yes Independently performs ADLs?: Yes (appropriate for developmental age)             Advance Directives (For Healthcare) Does patient have an advance directive?: No Would patient like information on creating an advanced directive?: No - patient declined information    Additional Information 1:1 In Past 12 Months?: No CIRT Risk: No Elopement Risk: No Does patient have medical clearance?: Yes     Disposition:  Disposition Initial Assessment Completed for this Encounter: Yes Disposition of Patient: Other dispositions Other disposition(s): Other (Comment) (Re-evaluate in the AM)  On Site Evaluation by:   Reviewed with Physician:    Maryelizabeth Rowanorbett, [place] A 11/13/2014 1:16 PM

## 2014-11-13 NOTE — ED Notes (Addendum)
Pt wanded by security, valuables inventoried and sheet attached to belongings to be given to receiving nurse. MD in to see patient at this time.   Valuables include a wallet with no cash/change/cards and a cellphone. Clothes charted on sheet.

## 2014-11-13 NOTE — ED Notes (Signed)
Pt comes to desk upset requesting something for detox from alcohol. Reports that was told by a male provider that he would be getting ativan, reports that he is "hurting like hell" and he has not seen any other provider.

## 2014-11-13 NOTE — ED Notes (Signed)
Patient with med-seeking behaviors, asking repeatedly for narcotic medications and phenergan. Pt informed that he has Ibuprofen ordered and Zofran. Pt states "That shit don't work". RN again told pt that those were his options, pt states "Why the hell can't they order me something stronger!". Pt irritable.

## 2014-11-13 NOTE — ED Provider Notes (Signed)
CSN: [id number]     Arrival date & time 11/13/14  1136 History   First MD Initiated Contact with Patient 11/13/14 1144     Chief Complaint  Patient presents with  . Suicidal  . Withdrawal  . Medical Clearance     (Consider location/radiation/quality/duration/timing/severity/associated sxs/prior Treatment) Patient is a 34 y.o. male presenting with mental health disorder.  Mental Health Problem Presenting symptoms: hallucinations, suicidal thoughts and suicide attempt   Degree of incapacity (severity):  Severe Onset quality:  Gradual Timing:  Constant Progression:  Worsening Chronicity:  Recurrent Context: alcohol use and noncompliance   Relieved by:  Nothing Worsened by:  Nothing tried Associated symptoms: anhedonia, feelings of worthlessness, headaches and poor judgment     Past Medical History  Diagnosis Date  . Depression   . PTSD (post-traumatic stress disorder)   . Social anxiety disorder   . Seizures   . Adrenal insufficiency   . Hyperlipidemia   . Thyroid disease    Past Surgical History  Procedure Laterality Date  . Ear tube removal     History reviewed. No pertinent family history. History  Substance Use Topics  . Smoking status: Current Every Day Smoker  . Smokeless tobacco: Not on file  . Alcohol Use: No    Review of Systems  Neurological: Positive for headaches.  Psychiatric/Behavioral: Positive for suicidal ideas and hallucinations.  All other systems reviewed and are negative.     Allergies  Review of patient's allergies indicates no known allergies.  Home Medications   Prior to Admission medications   Medication Sig Start Date End Date Taking? Authorizing Provider  alprazolam Prudy Feeler(XANAX) 2 MG tablet Take 2 mg by mouth 2 (two) times daily.   Yes Historical Provider, MD  DULoxetine (CYMBALTA) 60 MG capsule Take 60 mg by mouth daily.   Yes Historical Provider, MD  fenofibrate 54 MG tablet Take 54 mg by mouth daily.   Yes Historical Provider, MD   fluticasone (FLONASE) 50 MCG/ACT nasal spray Place 2 sprays into both nostrils daily.   Yes Historical Provider, MD  hydrocortisone (CORTEF) 20 MG tablet Take 30 mg by mouth 2 (two) times daily.    Yes Historical Provider, MD  levothyroxine (SYNTHROID, LEVOTHROID) 100 MCG tablet Take 100 mcg by mouth daily before breakfast.   Yes Historical Provider, MD  omega-3 acid ethyl esters (LOVAZA) 1 G capsule Take 2 g by mouth 2 (two) times daily.   Yes Historical Provider, MD  PHENobarbital (LUMINAL) 97.2 MG tablet Take 97.2 mg by mouth 3 (three) times daily.   Yes Historical Provider, MD  QUEtiapine (SEROQUEL) 300 MG tablet Take 300 mg by mouth at bedtime.   Yes Historical Provider, MD  risperidone (RISPERDAL) 4 MG tablet Take 4 mg by mouth at bedtime.   Yes Historical Provider, MD   BP 112/58 mmHg  Pulse 71  Temp(Src) 98 F (36.7 C)  Resp 20  SpO2 96% Physical Exam  Constitutional: He is oriented to person, place, and time. He appears well-developed and well-nourished. No distress.  HENT:  Head: Normocephalic and atraumatic.  Eyes: Conjunctivae are normal. No scleral icterus.  Neck: Neck supple.  Cardiovascular: Normal rate and intact distal pulses.   Pulmonary/Chest: Effort normal. No stridor. No respiratory distress.  Abdominal: Normal appearance. He exhibits no distension.  Neurological: He is alert and oriented to person, place, and time.  Skin: Skin is warm and dry. No rash noted.  Psychiatric: He has a normal mood and affect. His behavior is normal.  Nursing note and vitals reviewed.   ED Course  Procedures (including critical care time) Labs Review Labs Reviewed  ACETAMINOPHEN LEVEL - Abnormal; Notable for the following:    Acetaminophen (Tylenol), Serum <10.0 (*)    All other components within normal limits  URINE RAPID DRUG SCREEN (HOSP PERFORMED) - Abnormal; Notable for the following:    Tetrahydrocannabinol POSITIVE (*)    Barbiturates POSITIVE (*)    All other  components within normal limits  CBC  COMPREHENSIVE METABOLIC PANEL  ETHANOL  SALICYLATE LEVEL    Imaging Review No results found.   EKG Interpretation None      MDM   Final diagnoses:  Suicidal ideations  Polysubstance abuse    34 yo male presenting with SI and hallucinations.  He also states he tried to commit suicide 4 days ago by taking all of his phenobarbital.  The next day, he states he tried to commit suicide by taking all of his xanax.  He still feels suicidal now.  He reports having a seizure yesterday while with friends.  Last drink and xanax reported as yesterday.  Plan labs, CIWA, TTS consult.   Psych Hold.   Blake Divine, MD 11/13/14 [phone]

## 2014-11-14 DIAGNOSIS — F1910 Other psychoactive substance abuse, uncomplicated: Secondary | ICD-10-CM | POA: Diagnosis not present

## 2014-11-14 DIAGNOSIS — F333 Major depressive disorder, recurrent, severe with psychotic symptoms: Secondary | ICD-10-CM

## 2014-11-14 DIAGNOSIS — R45851 Suicidal ideations: Secondary | ICD-10-CM | POA: Diagnosis not present

## 2014-11-14 NOTE — ED Notes (Signed)
C/O diarhea with nausea.  Medication requested and received.

## 2014-11-14 NOTE — ED Notes (Signed)
Patient with dull, flat affect on assessment. Pt with multiple somatic complaints asking for "stronger pain medications". Pt states "I might as well leave if I'm not gonna get anything here". Pt with passive SI and no plan. No s/s of distress noted at this time.

## 2014-11-14 NOTE — ED Notes (Signed)
Patient isolative to room. CIWA deferred due to negative etoh level and stable VS. Acuity low.

## 2014-11-14 NOTE — ED Notes (Signed)
High acuity

## 2014-11-14 NOTE — ED Notes (Addendum)
Demanding pain medications.  Informed that next dose is due after 1900.  Irritable. Stated he eas vomiting in bathroom.  Fluids given.Acuity moderate to high.

## 2014-11-14 NOTE — Consult Note (Signed)
[facility] Psychiatry Consult   Reason for Consult:  Suicidal ideations Referring Physician:  EDP Patient Identification: Mark [name] MRN:  [id number] Principal Diagnosis:Severe recurrent major depression with psychotic features Diagnosis:   Patient Active Problem List   Diagnosis Date Noted  . Severe recurrent major depression with psychotic features [F33.3] 10/30/2014    Priority: High  . Suicidal ideations [R45.851] 10/30/2014    Priority: High    Total Time spent with patient: 30 minutes  Subjective:   Mark [name] is a 34 y.o. male patient to be re-evaluated for discharge tomorrow.  HPI:  The patient continues to ask for detox (benzodiazepine) medications stating he was drinking a fifth a day but BAL was 0 and he just left [facility]'s ED.  He also stated he overdosed on Xanax and phenobarbital but negative on benzodiazepines.  Mark [name] also stated he was on other medications that were not verified (Seroquel,..) and not reordered.  He is well known to hospital jump along with seeking medications.  Mark [name] is homeless and manipulates to get into EDs.  He is not responding to internal stimuli, denies homicidal ideations and hallucinations today.  Re-evaluate for discharge tomorrow as he appears to be at his baseline. HPI Elements:   Location:  generalized. Quality:  acute. Severity:  severe. Timing:  constant. Duration:  two weeks. Context:  stressors.  Past Medical History:  Past Medical History  Diagnosis Date  . Depression   . PTSD (post-traumatic stress disorder)   . Social anxiety disorder   . Seizures   . Adrenal insufficiency   . Hyperlipidemia   . Thyroid disease     Past Surgical History  Procedure Laterality Date  . Ear tube removal     Family History: History reviewed. No pertinent family history. Social History:  History  Alcohol Use No     History  Drug Use No    History   Social History  . Marital Status: Divorced    Spouse Name:  N/A  . Number of Children: N/A  . Years of Education: N/A   Social History Main Topics  . Smoking status: Current Every Day Smoker  . Smokeless tobacco: Not on file  . Alcohol Use: No  . Drug Use: No  . Sexual Activity: Not on file   Other Topics Concern  . None   Social History Narrative   Additional Social History:                          Allergies:  No Known Allergies  Labs:  Results for orders placed or performed during the hospital encounter of 11/13/14 (from the past 48 hour(s))  CBC     Status: None   Collection Time: 11/13/14 12:21 PM  Result Value Ref Range   WBC 7.7 4.0 - 10.5 K/uL   RBC 4.98 4.22 - 5.81 MIL/uL   Hemoglobin 15.3 13.0 - 17.0 g/dL   HCT 45.5 39.0 - 52.0 %   MCV 91.4 78.0 - 100.0 fL   MCH 30.7 26.0 - 34.0 pg   MCHC 33.6 30.0 - 36.0 g/dL   RDW 13.7 11.5 - 15.5 %   Platelets 284 150 - 400 K/uL  Comprehensive metabolic panel     Status: None   Collection Time: 11/13/14 12:21 PM  Result Value Ref Range   Sodium 138 135 - 145 mmol/L   Potassium 4.4 3.5 - 5.1 mmol/L   Chloride 103 96 -  112 mmol/L   CO2 25 19 - 32 mmol/L   Glucose, Bld 97 70 - 99 mg/dL   BUN 8 6 - 23 mg/dL   Creatinine, Ser 0.86 0.50 - 1.35 mg/dL   Calcium 9.1 8.4 - 10.5 mg/dL   Total Protein 7.5 6.0 - 8.3 g/dL   Albumin 4.5 3.5 - 5.2 g/dL   AST 21 0 - 37 U/L   ALT 23 0 - 53 U/L   Alkaline Phosphatase 87 39 - 117 U/L   Total Bilirubin 0.4 0.3 - 1.2 mg/dL   GFR calc non Af Amer >90 >90 mL/min   GFR calc Af Amer >90 >90 mL/min    Comment: (NOTE) The eGFR has been calculated using the CKD EPI equation. This calculation has not been validated in all clinical situations. eGFR's persistently <90 mL/min signify possible Chronic Kidney Disease.    Anion gap 10 5 - 15  Acetaminophen level     Status: Abnormal   Collection Time: 11/13/14 12:22 PM  Result Value Ref Range   Acetaminophen (Tylenol), Serum <10.0 (L) 10 - 30 ug/mL    Comment:        THERAPEUTIC  CONCENTRATIONS VARY SIGNIFICANTLY. A RANGE OF 10-30 ug/mL MAY BE AN EFFECTIVE CONCENTRATION FOR MANY PATIENTS. HOWEVER, SOME ARE BEST TREATED AT CONCENTRATIONS OUTSIDE THIS RANGE. ACETAMINOPHEN CONCENTRATIONS >150 ug/mL AT 4 HOURS AFTER INGESTION AND >50 ug/mL AT 12 HOURS AFTER INGESTION ARE OFTEN ASSOCIATED WITH TOXIC REACTIONS.   Ethanol (ETOH)     Status: None   Collection Time: 11/13/14 12:22 PM  Result Value Ref Range   Alcohol, Ethyl (B) <5 0 - 9 mg/dL    Comment:        LOWEST DETECTABLE LIMIT FOR SERUM ALCOHOL IS 11 mg/dL FOR MEDICAL PURPOSES ONLY   Salicylate level     Status: None   Collection Time: 11/13/14 12:22 PM  Result Value Ref Range   Salicylate Lvl <4.7 2.8 - 20.0 mg/dL  Urine Drug Screen     Status: Abnormal   Collection Time: 11/13/14 12:25 PM  Result Value Ref Range   Opiates NONE DETECTED NONE DETECTED   Cocaine NONE DETECTED NONE DETECTED   Benzodiazepines NONE DETECTED NONE DETECTED   Amphetamines NONE DETECTED NONE DETECTED   Tetrahydrocannabinol POSITIVE (A) NONE DETECTED   Barbiturates POSITIVE (A) NONE DETECTED    Comment:        DRUG SCREEN FOR MEDICAL PURPOSES ONLY.  IF CONFIRMATION IS NEEDED FOR ANY PURPOSE, NOTIFY LAB WITHIN 5 DAYS.        LOWEST DETECTABLE LIMITS FOR URINE DRUG SCREEN Drug Class       Cutoff (ng/mL) Amphetamine      1000 Barbiturate      200 Benzodiazepine   654 Tricyclics       650 Opiates          300 Cocaine          300 THC              50     Vitals: Blood pressure 136/69, pulse 90, temperature 98.5 F (36.9 C), temperature source Oral, resp. rate 18, SpO2 97 %.  Risk to Self: Suicidal Ideation: Yes-Currently Present Suicidal Intent: Yes-Currently Present Is patient at risk for suicide?: Yes Suicidal Plan?: Yes-Currently Present Specify Current Suicidal Plan: overdosed Access to Means: Yes Specify Access to Suicidal Means: overdose What has been your use of drugs/alcohol within the last 12  months?: alcohol, benzo How many times?: 2  Triggers for Past Attempts: Unknown Intentional Self Injurious Behavior: None Risk to Others: Homicidal Ideation: No-Not Currently/Within Last 6 Months Thoughts of Harm to Others: No-Not Currently Present/Within Last 6 Months Current Homicidal Intent: No-Not Currently/Within Last 6 Months Current Homicidal Plan: No-Not Currently/Within Last 6 Months Access to Homicidal Means: No History of harm to others?: No Assessment of Violence: None Noted Does patient have access to weapons?: No Criminal Charges Pending?: No Does patient have a court date: No Prior Inpatient Therapy: Prior Inpatient Therapy: Yes Prior Therapy Dates: 2016 Prior Therapy Facilty/Provider(s): [facility] Reason for Treatment: Depression Prior Outpatient Therapy: Prior Outpatient Therapy: Yes Prior Therapy Dates: 2016 Prior Therapy Facilty/Provider(s): [facility] Reason for Treatment: Mood Disorder  Current Facility-Administered Medications  Medication Dose Route Frequency Provider Last Rate Last Dose  . DULoxetine (CYMBALTA) DR capsule 60 mg  60 mg Oral Daily Serita Grit, MD   60 mg at 11/14/14 1031  . hydrocortisone (CORTEF) tablet 30 mg  30 mg Oral BID Serita Grit, MD   30 mg at 11/14/14 1031  . ibuprofen (ADVIL,MOTRIN) tablet 600 mg  600 mg Oral Q8H PRN Serita Grit, MD   600 mg at 11/14/14 1044  . levothyroxine (SYNTHROID, LEVOTHROID) tablet 100 mcg  100 mcg Oral QAC breakfast Patrecia Pour, NP   100 mcg at 11/14/14 1030  . ondansetron (ZOFRAN) tablet 4 mg  4 mg Oral Q8H PRN Serita Grit, MD   4 mg at 11/13/14 2044  . risperiDONE (RISPERDAL) tablet 4 mg  4 mg Oral QHS Serita Grit, MD   4 mg at 11/13/14 2044   Current Outpatient Prescriptions  Medication Sig Dispense Refill  . alprazolam (XANAX) 2 MG tablet Take 2 mg by mouth 2 (two) times daily.    . DULoxetine (CYMBALTA) 60 MG capsule Take 60 mg by mouth daily.    . fenofibrate 54 MG tablet Take 54 mg by mouth daily.     . fluticasone (FLONASE) 50 MCG/ACT nasal spray Place 2 sprays into both nostrils daily.    . hydrocortisone (CORTEF) 20 MG tablet Take 30 mg by mouth 2 (two) times daily.     Marland Kitchen levothyroxine (SYNTHROID, LEVOTHROID) 100 MCG tablet Take 100 mcg by mouth daily before breakfast.    . omega-3 acid ethyl esters (LOVAZA) 1 G capsule Take 2 g by mouth 2 (two) times daily.    Marland Kitchen PHENobarbital (LUMINAL) 97.2 MG tablet Take 97.2 mg by mouth 3 (three) times daily.    . QUEtiapine (SEROQUEL) 300 MG tablet Take 300 mg by mouth at bedtime.    . risperidone (RISPERDAL) 4 MG tablet Take 4 mg by mouth at bedtime.     Musculoskeletal: Strength & Muscle Tone: within normal limits Gait & Station: normal Patient leans: N/A  Psychiatric Specialty Exam:     Blood pressure 103/50, pulse 60, temperature 98.1 F (36.7 C), temperature source Oral, resp. rate 18, SpO2 98 %.There is no weight on file to calculate BMI.  General Appearance:  Dishelveed  Eye Contact::  Good  Speech:  Normal Rate  Volume:  Normal  Mood:  Anxious  Affect:  Blunt  Thought Process:  Coherent  Orientation:  Full (Time, Place, and Person)  Thought Content:  WDL  Suicidal Thoughts:  Yes, passive  Homicidal Thoughts:  No  Memory:  Immediate;   Good Recent;   Good Remote;   Good  Judgement:  Fair  Insight:  Fair  Psychomotor Activity:  Normal  Concentration:  Good  Recall:  Good  Fund of Knowledge:Good  Language: Good  Akathisia:  No  Handed:  Right  AIMS (if indicated):     Assets:  Leisure Time Physical Health Resilience Social Support  Sleep:     Cognition: WNL  ADL's:  Intact   Medical Decision Making: Review of Psycho-Social Stressors (1), Review or order clinical lab tests (1) and Review of Medication Regimen & Side Effects (2)  Treatment Plan Summary:  Plan:  Re-evaluate in the am for discharge, appears to be at his baseline Disposition:  RE-evaluate in the am  Waylan Boga, PMH-NP 11/14/2014 1:52 PM

## 2014-11-15 ENCOUNTER — Emergency Department (HOSPITAL_COMMUNITY)
Admission: EM | Admit: 2014-11-15 | Discharge: 2014-11-15 | Disposition: A | Discharge status: Home / Self Care | Payer: Self-pay | Attending: Emergency Medicine | Admitting: Emergency Medicine

## 2014-11-15 ENCOUNTER — Encounter (HOSPITAL_COMMUNITY): Payer: Self-pay | Admitting: Emergency Medicine

## 2014-11-15 ENCOUNTER — Ambulatory Visit (HOSPITAL_COMMUNITY)
Admission: AD | Admit: 2014-11-15 | Discharge: 2014-11-15 | Disposition: A | Discharge status: Home / Self Care | Payer: Federal, State, Local not specified - Other | Attending: Psychiatry | Admitting: Psychiatry

## 2014-11-15 DIAGNOSIS — F1910 Other psychoactive substance abuse, uncomplicated: Secondary | ICD-10-CM

## 2014-11-15 DIAGNOSIS — F4310 Post-traumatic stress disorder, unspecified: Secondary | ICD-10-CM | POA: Insufficient documentation

## 2014-11-15 DIAGNOSIS — Z720 Tobacco use: Secondary | ICD-10-CM | POA: Insufficient documentation

## 2014-11-15 DIAGNOSIS — G43909 Migraine, unspecified, not intractable, without status migrainosus: Secondary | ICD-10-CM | POA: Insufficient documentation

## 2014-11-15 DIAGNOSIS — F329 Major depressive disorder, single episode, unspecified: Secondary | ICD-10-CM | POA: Insufficient documentation

## 2014-11-15 DIAGNOSIS — E785 Hyperlipidemia, unspecified: Secondary | ICD-10-CM | POA: Insufficient documentation

## 2014-11-15 DIAGNOSIS — Z79899 Other long term (current) drug therapy: Secondary | ICD-10-CM | POA: Insufficient documentation

## 2014-11-15 DIAGNOSIS — F419 Anxiety disorder, unspecified: Secondary | ICD-10-CM | POA: Insufficient documentation

## 2014-11-15 DIAGNOSIS — E079 Disorder of thyroid, unspecified: Secondary | ICD-10-CM | POA: Insufficient documentation

## 2014-11-15 DIAGNOSIS — R443 Hallucinations, unspecified: Secondary | ICD-10-CM | POA: Insufficient documentation

## 2014-11-15 DIAGNOSIS — Z7952 Long term (current) use of systemic steroids: Secondary | ICD-10-CM | POA: Insufficient documentation

## 2014-11-15 DIAGNOSIS — R45851 Suicidal ideations: Secondary | ICD-10-CM | POA: Insufficient documentation

## 2014-11-15 NOTE — [facility] Suicide Risk Assessment (Cosign Needed)
Suicide Risk Assessment  Discharge Assessment   [facility]   Demographic Factors:  Male, Adolescent or young adult, Caucasian, Low socioeconomic status, Living alone and Unemployed  Total Time spent with patient: 20 minutes  Musculoskeletal: Strength & Muscle Tone: within normal limits Gait & Station: normal Patient leans: N/A  Psychiatric Specialty Exam:     Blood pressure 106/59, pulse 70, temperature 98.8 F (37.1 C), temperature source Oral, resp. rate 20, SpO2 98 %.There is no weight on file to calculate BMI.  General Appearance: Casual  Eye Contact::  Minimal  Speech:  Clear and Coherent and Normal Rate409  Volume:  Normal  Mood:  Angry and Anxious  Affect:  Congruent  Thought Process:  Coherent, Goal Directed and Intact  Orientation:  Full (Time, Place, and Person)  Thought Content:  WDL  Suicidal Thoughts:  No  Homicidal Thoughts:  No  Memory:  Immediate;   Good Recent;   Good Remote;   Good  Judgement:  Fair  Insight:  Shallow  Psychomotor Activity:  Normal  Concentration:  Good  Recall:  NA  Fund of Knowledge:Fair  Language: Good  Akathisia:  NA  Handed:  Right  AIMS (if indicated):     Assets:  Desire for Improvement  Sleep:     Cognition: WNL  ADL's:  Intact      Has this patient used any form of tobacco in the last 30 days? (Cigarettes, Smokeless Tobacco, Cigars, and/or Pipes) Yes, A prescription for an FDA-approved tobacco cessation medication was offered at discharge and the patient refused  Mental Status Per Nursing Assessment::   On Admission:     Current Mental Status by Physician: NA  Loss Factors: NA  Historical Factors: Prior suicide attempts  Risk Reduction Factors:   Religious beliefs about death, Positive social support and Positive coping skills or problem solving skills  Continued Clinical Symptoms:  Depression:   Insomnia Alcohol/Substance Abuse/Dependencies  Cognitive Features That Contribute  To Risk:  Polarized thinking    Suicide Risk:  Minimal: No identifiable suicidal ideation.  Patients presenting with no risk factors but with morbid ruminations; may be classified as minimal risk based on the severity of the depressive symptoms  Principal Problem: <principal problem not specified> Discharge Diagnoses:  Patient Active Problem List   Diagnosis Date Noted  . Polysubstance abuse [F19.10]   . Severe recurrent major depression with psychotic features [F33.3] 10/30/2014  . Suicidal ideations [R45.851] 10/30/2014      Plan Of Care/Follow-up recommendations:  Activity:  AS TOLEARTED  Diet:  REGULAR  Is patient on multiple antipsychotic therapies at discharge:  No   Has Patient had three or more failed trials of antipsychotic monotherapy by history:  No  Recommended Plan for Multiple Antipsychotic Therapies: NA    Eleana Tocco C   PMHNP-BC 11/15/2014, 9:36 AM

## 2014-11-15 NOTE — [facility] Counselor (Signed)
Counselor attempted to contact Pt's medicaid provider in order to set-up a care plan. [place] Counselor is [facility] a [phone]  Wolfgang PhoenixBrandi Travus Oren, [facility] Triage Specialist

## 2014-11-15 NOTE — Consult Note (Signed)
[facility] Face-to-Face Psychiatry Consult   Reason for Consult: Polysubstance abuse, Unspecified mood disorder Referring Physician:  EDP Patient Identification: Mark [name] MRN:  [id number] Principal Diagnosis: Polysubstance abuse Diagnosis:   Patient Active Problem List   Diagnosis Date Noted  . Polysubstance abuse [F19.10]     Priority: High  . Severe recurrent major depression with psychotic features [F33.3] 10/30/2014  . Suicidal ideations [R45.851] 10/30/2014    Total Time spent with patient: 45 minutes  Subjective:   Mark [name] is a 34 y.o. male patient admitted with Polysubstance abuse, mood disorder, unspecified.  HPI: 34 year old caucasian male was evaluated today for suicidal ideation.  Patient also reported OD on 90 tablets of Xanax and Phenobarbital on Saturday  Patient's UDS was positive for Marijuana and Barbiturates.  Patient was discharged from our ER yesterday and was brought back by EMS for OD on these pills.  This morning he was awake, alert and oriented x3.   He reported good sleep and appetite.  He did not want  to answer most of the questions asked of him.  Patient reported that he lives in [place] and could not go to [facility]  because he has been warned not to come there again.  Patient stated that he could not explain where he got the Phenobarbital and Xanax he took on Saturday.Marland Kitchen  He stated that he was hearing demonic voices but could not explain further.  Patient also stated that he did not have a place to stay and wanted to be in the hospital until he can secure a place.  Patient denied Si/HI/AVH.  He is discharged home to follow up with his outpatient provider at [facility].  HPI Elements:   Location:  Unspecified mood disorder, Polysubstance abuse. Quality:  moderate. Severity:  moderate. Timing:  acute. Duration:  Polysubstance abuse. Context:  seeking treatment for substance abuse.  Past Medical History:  Past Medical History  Diagnosis Date  . Depression   .  PTSD (post-traumatic stress disorder)   . Social anxiety disorder   . Seizures   . Adrenal insufficiency   . Hyperlipidemia   . Thyroid disease     Past Surgical History  Procedure Laterality Date  . Ear tube removal     Family History: History reviewed. No pertinent family history. Social History:  History  Alcohol Use No     History  Drug Use No    History   Social History  . Marital Status: Divorced    Spouse Name: N/A  . Number of Children: N/A  . Years of Education: N/A   Social History Main Topics  . Smoking status: Current Every Day Smoker  . Smokeless tobacco: Not on file  . Alcohol Use: No  . Drug Use: No  . Sexual Activity: Not on file   Other Topics Concern  . None   Social History Narrative   Additional Social History:                          Allergies:  No Known Allergies  Labs: No results found for this or any previous visit (from the past 48 hour(s)).  Vitals: Blood pressure 106/59, pulse 70, temperature 98.8 F (37.1 [name]), temperature source Oral, resp. rate 20, SpO2 98 %.  Risk to Self: Suicidal Ideation: Yes-Currently Present Suicidal Intent: Yes-Currently Present Is patient at risk for suicide?: Yes Suicidal Plan?: Yes-Currently Present Specify Current Suicidal Plan: overdosed Access to Means: Yes  Specify Access to Suicidal Means: overdose What has been your use of drugs/alcohol within the last 12 months?: alcohol, benzo How many times?: 2 Triggers for Past Attempts: Unknown Intentional Self Injurious Behavior: None Risk to Others: Homicidal Ideation: No-Not Currently/Within Last 6 Months Thoughts of Harm to Others: No-Not Currently Present/Within Last 6 Months Current Homicidal Intent: No-Not Currently/Within Last 6 Months Current Homicidal Plan: No-Not Currently/Within Last 6 Months Access to Homicidal Means: No History of harm to others?: No Assessment of Violence: None Noted Does patient have access to weapons?:  No Criminal Charges Pending?: No Does patient have a court date: No Prior Inpatient Therapy: Prior Inpatient Therapy: Yes Prior Therapy Dates: 2016 [facility] Reason for Treatment: Depression Prior Outpatient Therapy: Prior Outpatient Therapy: Yes Prior Therapy Dates: 2016 Prior Therapy Facilty/Provider(s): [facility] Reason for Treatment: Mood Disorder  No current facility-administered medications for this encounter.   Current Outpatient Prescriptions  Medication Sig Dispense Refill  . DULoxetine (CYMBALTA) 60 MG capsule Take 60 mg by mouth daily.    . fenofibrate 54 MG tablet Take 54 mg by mouth daily.    . fluticasone (FLONASE) 50 MCG/ACT nasal spray Place 2 sprays into both nostrils daily as needed for allergies.     . hydrocortisone (CORTEF) 20 MG tablet Take 30 mg by mouth 2 (two) times daily.     Marland Kitchen. levothyroxine (SYNTHROID, LEVOTHROID) 100 MCG tablet Take 100 mcg by mouth daily before breakfast.    . omega-3 acid ethyl esters (LOVAZA) 1 G capsule Take 2 g by mouth 2 (two) times daily.    . risperidone (RISPERDAL) 4 MG tablet Take 4 mg by mouth at bedtime.      Musculoskeletal: Strength & Muscle Tone: within normal limits Gait & Station: normal Patient leans: N/A  Psychiatric Specialty Exam:     Blood pressure 106/59, pulse 70, temperature 98.8 F (37.1 [name]), temperature source Oral, resp. rate 20, SpO2 98 %.There is no weight on file to calculate BMI.  General Appearance: Casual and Fairly Groomed  Eye Contact::  Minimal  Speech:  Clear and Coherent and Normal Rate  Volume:  Normal  Mood:  Angry  Affect:  Congruent  Thought Process:  Coherent, Goal Directed and Intact  Orientation:  Full (Time, Place, and Person)  Thought Content:  WDL  Suicidal Thoughts:  No  Homicidal Thoughts:  No  Memory:  Immediate;   Good Recent;   Good Remote;   Good  Judgement:  Fair  Insight:  Shallow  Psychomotor Activity:  Normal  Concentration:  Good   Recall:  NA  Fund of Knowledge:Fair  Language: NA  Akathisia:  NA  Handed:  Right  AIMS (if indicated):     Assets:  Desire for Improvement  ADL's:  Intact  Cognition: WNL  Sleep:      Medical Decision Making: Established Problem, Stable/Improving (1)  Treatment Plan Summary: Plan dischrage home  Plan:  discgharged home Disposition: see above  Mark [name], Mark [name] 11/15/2014 6:05 PM Patient seen face-to-face for psychiatric evaluation, chart reviewed and case discussed with the physician extender and developed treatment plan. Reviewed the information documented and agree with the treatment plan. Mark MinsMojeed Antonella Upson, MD

## 2014-11-15 NOTE — ED Provider Notes (Signed)
CSN: [id number]     Arrival date & time 11/15/14  1220 History   First MD Initiated Contact with Patient 11/15/14 1223     Chief Complaint  Patient presents with  . Suicidal     (Consider location/radiation/quality/duration/timing/severity/associated sxs/prior Treatment) HPI Comments: 34 year old male with history of recurrent polysubstance abuse, suicidal ideation, depression with psychotic features presents with auditory hallucinations and persistent suicidal ideation. Patient was just discharged this morning from psychiatry with plans for outpatient follow-up. Patient has had recurrent similar presentations. Patient has no specific reason for suicide ideation however his had thoughts of throwing himself in front of a car. Patient left the hospital this am [facility].  Patient feels similar to previous presentations.     The history is provided by the patient.    Past Medical History  Diagnosis Date  . Depression   . PTSD (post-traumatic stress disorder)   . Social anxiety disorder   . Seizures   . Adrenal insufficiency   . Hyperlipidemia   . Thyroid disease    Past Surgical History  Procedure Laterality Date  . Ear tube removal     No family history on file. History  Substance Use Topics  . Smoking status: Current Every Day Smoker  . Smokeless tobacco: Not on file  . Alcohol Use: No    Review of Systems  Constitutional: Negative for fever and chills.  HENT: Negative for congestion.   Eyes: Negative for visual disturbance.  Respiratory: Negative for shortness of breath.   Cardiovascular: Negative for chest pain.  Gastrointestinal: Negative for vomiting and abdominal pain.  Genitourinary: Negative for dysuria and flank pain.  Musculoskeletal: Negative for back pain, neck pain and neck stiffness.  Skin: Negative for rash.  Neurological: Negative for light-headedness and  headaches.  Psychiatric/Behavioral: Positive for suicidal ideas and hallucinations.      Allergies  Review of patient's allergies indicates no known allergies.  Home Medications   Prior to Admission medications   Medication Sig Start Date End Date Taking? Authorizing Provider  DULoxetine (CYMBALTA) 60 MG capsule Take 60 mg by mouth daily.   Yes Historical Provider, MD  fenofibrate 54 MG tablet Take 54 mg by mouth daily.   Yes Historical Provider, MD  fluticasone (FLONASE) 50 MCG/ACT nasal spray Place 2 sprays into both nostrils daily as needed for allergies.    Yes Historical Provider, MD  hydrocortisone (CORTEF) 20 MG tablet Take 30 mg by mouth 2 (two) times daily.    Yes Historical Provider, MD  levothyroxine (SYNTHROID, LEVOTHROID) 100 MCG tablet Take 100 mcg by mouth daily before breakfast.   Yes Historical Provider, MD  omega-3 acid ethyl esters (LOVAZA) 1 G capsule Take 2 g by mouth 2 (two) times daily.   Yes Historical Provider, MD  risperidone (RISPERDAL) 4 MG tablet Take 4 mg by mouth at bedtime.   Yes Historical Provider, MD   BP 125/65 mmHg  Pulse 80  Temp(Src) 98 F (36.7 C) (Oral)  Resp 18  SpO2 100% Physical Exam  Constitutional: He is oriented to person, place, and time. He appears well-developed and well-nourished.  HENT:  Head: Normocephalic and atraumatic.  Eyes: Conjunctivae are normal. Right eye exhibits no discharge. Left eye exhibits no discharge.  Neck: Normal range of motion. Neck supple. No tracheal deviation present.  Cardiovascular: Normal rate and regular rhythm.   Pulmonary/Chest: Effort normal and breath sounds normal.  Abdominal: Soft. He  exhibits no distension. There is no tenderness. There is no guarding.  Musculoskeletal: He exhibits no edema.  Neurological: He is alert and oriented to person, place, and time.  Skin: Skin is warm. No rash noted.  Psychiatric: His mood appears not anxious. His affect is not blunt. His speech is not rapid and/or  pressured. He is actively hallucinating. He expresses suicidal ideation. He expresses no homicidal ideation. He expresses no homicidal plans.  Nursing note and vitals reviewed.   ED Course  Procedures (including critical care time) Labs Review Labs Reviewed - No data to display  Imaging Review No results found.   EKG Interpretation None      MDM   Final diagnoses:  Suicidal ideation  Polysubstance abuse   Patient re-presented to [facility] Emergency department with suicidal ideation. Discussed with Behavioral Health, they're aware that the patient came back, they assessed him this morning and his concerns are the same as this morning. They do not recommend inpatient treatment and their recommendations from this morning still stand. The psychiatrist that evaluated this morning is aware of this and recommends outpatient treatment.   Medications - No data to display  Filed Vitals:   11/15/14 1227  BP: 125/65  Pulse: 80  Temp: 98 F (36.7 C)  TempSrc: Oral  Resp: 18  SpO2: 100%    Final diagnoses:  Suicidal ideation  Polysubstance abuse        Blane Ohara, MD 11/15/14 1511

## 2014-11-15 NOTE — Plan of Care (Signed)
[facility]  Reason for Crisis Plan:  Substance Abuse   Plan of Care:  Referral for Substance Abuse  Patient frequenting the Emergency Department and seeking medications, specifically benzos. During admission on 11/03/2014 Patient was gagging pills at the sink, and later found broken pills in patient room that did not belong to patient, per patient. [facility] [place]. [facility] 11/13/2014 after being [facility] within 24 hours after [name] reported overdose on 90 benzodiazepines, the day before 90 phenobarbpital and 90 xanax.   Upon discharge, patient became upset and punched the wall. Patient plans to follow up with [facility].   Please assess and dishcarge patient from triage. Pt is known to be medication seeking and malingering.  Family Support:    None identified   Current Living Environment:  [place] [facility]   Insurance:   Hospital Account    Name Acct ID Class Status Primary Coverage   Mark [name] [id number] Emergency Discharged/Not Billed None        Guarantor Account (for Hospital Account [id number])    Name Relation to Pt Service Area Active? Acct Type   Mark [name] Self CHSA Yes Personal/Family   Address Phone       [address] [place], [place] [id number] [phone](H)          Coverage Information (for Hospital Account [id number])    Not on file      Legal Guardian:    none   Primary Care Provider:  No PCP Per Patient  Current Outpatient Providers:  [facility]  Psychiatrist:  Name of Psychiatrist: [facility]  Counselor/Therapist:  Name of Therapist: [facility]  Compliant with Medications:  Yes  Additional Information:  Mark [name] 4/11/201610:58 AM

## 2014-11-15 NOTE — ED Notes (Signed)
Refused assessment and vital signs

## 2014-11-15 NOTE — ED Notes (Signed)
md at bedside

## 2014-11-15 NOTE — ED Notes (Signed)
Pt not in bed for discharge. Other staff saw pt walk out prior to receiving discharge papers.

## 2014-11-15 NOTE — BH Assessment (Signed)
Assessment Note  Mark [name] is an 34 y.o. male [facility]ation and auditory hallucinations.  HE reports making a suicide attempt earlier this week.  He states he took a bottle of phenobarbital and states that his neighbors called mobile crisis who brought him to the emergency department.  Mr Mark [name] reports having demonic auditory hallucinations, which he can no longer stand and drive his thoughts of harming himself.  He endorses depression including feelings of worthlessness, irritability, fatigue, insomnia, isolating behavior, feelings of guilt, decreased appetite, decreased memory, and anxiety.  He denies HI.  He admits to some substance abuse including occasional marijuana use and some alcohol use.  He reports he was [facility] this morning and told to go to [facility], but they could not see him for three weeks.  Pt was reviewed for admission by Janann Augustori Burkett, [facility] NP, who agrees the patient meets criteria.  This Clinical research associatewriter spoke with [facility] charge nurse who consulted with Dr Jannifer FranklinAkintayo and the patient may come over to [facility] for further evaluation and placement.  Not[facility] the patient [facility] Juel BurrowPelham.  He stated that once he arrives at the hospital, he will be discharged because "that doctor has something against me."  When asked what he wanted to do he stated defeatedly, "I guess I'll just go jump off a bridge."  This writer explained that I could not [facility] with thoughts of harming himself and a plan to do so.  He requested to go to another hospital where they could help him, but I explained that we did not have another hospital to transfer to.  [facility] AC, Tinta Tate and Pulte HomesCori Burkette, notified of patient's statements.  [facility] and disposition.   Axis I: Post Traumatic Stress Disorder and Psychotic Disorder NOS Axis II: Deferred Axis III:  Past Medical History  Diagnosis Date   . Depression   . PTSD (post-traumatic stress disorder)   . Social anxiety disorder   . Seizures   . Adrenal insufficiency   . Hyperlipidemia   . Thyroid disease    Axis IV: occupational problems and problems with access to health care services Axis V: 41-50 serious symptoms  Past Medical History:  Past Medical History  Diagnosis Date  . Depression   . PTSD (post-traumatic stress disorder)   . Social anxiety disorder   . Seizures   . Adrenal insufficiency   . Hyperlipidemia   . Thyroid disease     Past Surgical History  Procedure Laterality Date  . Ear tube removal      Family History: No family history on file.  Social History:  reports that he has been smoking.  He does not have any smokeless tobacco history on file. He reports that he does not drink alcohol or use illicit drugs.  Additional Social History:     CIWA:   COWS:    Allergies: No Known Allergies  Home Medications:  (Not in a hospital admission)  OB/GYN Status:  No LMP for male patient.  General Assessment Data Location of Assessment: [facility] Assessment Services Is this a Tele or Face-to-Face Assessment?: Face-to-Face Is this an Initial Assessment or a Re-assessment for this encounter?: Initial Assessment Living Arrangements: Alone Can pt return to current living arrangement?: Yes Admission Status: Voluntary Is patient capable of signing voluntary admission?: Yes Transfer from: Home Referral Source: Other     [facility] Crisis  Care Plan Living Arrangements: Alone Name of Psychiatrist: [facility] Name of Therapist: [facility]  Education Status Is patient currently in school?: No Highest grade of school patient has completed: Some college Name of school: NA Contact person: NA  Risk to self with the past 6 months Suicidal Ideation: Yes-Currently Present Suicidal Intent: No-Not Currently/Within Last 6 Months Is patient at risk for suicide?: Yes Suicidal Plan?: No-Not Currently/Within Last 6 Months Specify  Current Suicidal Plan: unpredictable Access to Means: Yes Specify Access to Suicidal Means: rx medication What has been your use of drugs/alcohol within the last 12 months?: reports occasional marijuana and alcohol usage Previous Attempts/Gestures: Yes How many times?: 3 (OD, jumped off a bridge, cut wrists) Triggers for Past Attempts: Other (Comment) (auditory hallucinations) Intentional Self Injurious Behavior: Cutting Comment - Self Injurious Behavior: pt has cut above left eyebrow, which he states was self harm Family Suicide History: Yes (grandfather) Recent stressful life event(s): Other (Comment), Recent negative physical changes (auditory hallucinations) Persecutory voices/beliefs?: Yes Depression: Yes Depression Symptoms: Despondent, Insomnia, Tearfulness, Isolating, Fatigue, Guilt, Loss of interest in usual pleasures, Feeling worthless/self pity, Feeling angry/irritable Substance abuse history and/or treatment for substance abuse?: No Suicide prevention information given to non-admitted patients: Not applicable  Risk to Others within the past 6 months Homicidal Ideation: No Thoughts of Harm to Others: No Current Homicidal Intent: No Current Homicidal Plan: No Access to Homicidal Means: No History of harm to others?: No Assessment of Violence: On admission Violent Behavior Description: punched wall at [facility] Does patient have access to weapons?: No Criminal Charges Pending?: No Does patient have a court date: No  Psychosis Hallucinations: Auditory (reports demonic growling) Delusions: None noted  Mental Status Report Appearance/Hygiene: Poor hygiene (malodorous) Eye Contact: Fair Motor Activity: Freedom of movement Speech: Logical/coherent, Soft Level of Consciousness: Alert Mood: Depressed, Anxious Affect: Blunted Anxiety Level: Panic Attacks Panic attack frequency: reports panic attacks Thought Processes: Coherent, Relevant Judgement: Impaired Orientation:  Person, Place, Time, Situation, Appropriate for developmental age Obsessive Compulsive Thoughts/Behaviors: Severe  Cognitive Functioning Concentration: Decreased Memory: Recent Intact, Remote Intact IQ: Average Insight: Fair Impulse Control: Poor Appetite: Poor Weight Loss: 20 Weight Gain: 0 Sleep: Decreased Total Hours of Sleep: 4 Vegetative Symptoms: Staying in bed, Decreased grooming  ADLScreening [facility] Assessment Services) Patient's cognitive ability adequate to safely complete daily activities?: Yes Patient able to express need for assistance with ADLs?: Yes Independently performs ADLs?: Yes (appropriate for developmental age)  Prior Inpatient Therapy Prior Inpatient Therapy: Yes Prior Therapy Dates: 2016 Prior Therapy Facilty/Provider(s): [facility] Reason for Treatment: Depression  Prior Outpatient Therapy Prior Outpatient Therapy: Yes Prior Therapy Dates: 2016 Prior Therapy Facilty/Provider(s): [facility] Reason for Treatment: Mood Disorder  ADL Screening (condition at time of admission) Patient's cognitive ability adequate to safely complete daily activities?: Yes Patient able to express need for assistance with ADLs?: Yes Independently performs ADLs?: Yes (appropriate for developmental age)       Abuse/Neglect Assessment (Assessment to be complete while patient is alone) Physical Abuse: Denies Verbal Abuse: Denies Sexual Abuse: Denies     Advance Directives (For Healthcare) Does patient have an advance directive?: No Would patient like information on creating an advanced directive?: No - patient declined information    Additional Information 1:1 In Past 12 Months?: No CIRT Risk: No Elopement Risk: No Does patient have medical clearance?: Yes     Disposition:  Disposition Initial Assessment Completed for this Encounter: Yes Disposition of Patient: Inpatient treatment program Type of inpatient treatment program: Adult Other disposition(s):  (  transfer  to [facility] for clearance and further eval)  On Site Evaluation by:   Reviewed with Physician:    Steward Ros 11/15/2014 12:25 PM

## 2014-11-15 NOTE — Discharge Instructions (Signed)
If you were given medicines take as directed.  If you are on coumadin or contraceptives realize their levels and effectiveness is altered by many different medicines.  If you have any reaction (rash, tongues swelling, other) to the medicines stop taking and see a physician.   Please follow up as directed and return to the ER or see a physician for new or worsening symptoms.  Thank you. Filed Vitals:   11/15/14 1227  BP: 125/65  Pulse: 80  Temp: 98 F (36.7 C)  TempSrc: Oral  Resp: 18  SpO2: 100%    Emergency Department Resource Guide 1) Find a Doctor and Pay Out of Pocket Although you won't have to find out who is covered by your insurance plan, it is a good idea to ask around and get recommendations. You will then need to call the office and see if the doctor you have chosen will accept you as a new patient and what types of options they offer for patients who are self-pay. Some doctors offer discounts or will set up payment plans for their patients who do not have insurance, but you will need to ask so you aren't surprised when you get to your appointment.  2) Contact Your Local Health Department Not all health departments have doctors that can see patients for sick visits, but many do, so it is worth a call to see if yours does. If you don't know where your local health department is, you can check in your phone book. The CDC also has a tool to help you locate your state's health department, and many state websites also have listings of all of their local health departments.  3) Find a Walk-in Clinic If your illness is not likely to be very severe or complicated, you may want to try a walk in clinic. These are popping up all over the country in pharmacies, drugstores, and shopping centers. They're usually staffed by nurse practitioners or physician assistants that have been trained to treat common illnesses and complaints. They're usually fairly quick and inexpensive. However, if you have  serious medical issues or chronic medical problems, these are probably not your best option.  No Primary Care Doctor: - Call Health Connect at  [phone] - they can help you locate a primary care doctor that  accepts your insurance, provides certain services, etc. - Physician Referral Service- [phone]  Chronic Pain Problems: Organization         Address  Phone   Notes  [facility]  [phone] Patients need to be referred by their primary care doctor.   Medication Assistance: Organization         Address  Phone   Notes  [facility] Medication [facility] [address] [place], [place] [address] [phone] --Must be a resident of [facility] -- Must have NO insurance coverage whatsoever (no Medicaid/ Medicare, etc.) -- The pt. MUST have a primary care doctor that directs their care regularly and follows them in the community   [facility]ist  [phone]   [employer]  [phone]    Agencies that provide inexpensive medical care: Organization         Address  Phone   Notes  [facility]  [phone]   [facility]    [phone]   [facility] [address] [place], [place] [address] [phone]   [facility] [address] [place],  [place] [phone]   Planned Parenthood    [phone]   [facility]    [phone]   [facility]  [address], [place] Phone:  [phone](336) [phone], Fax:  [phone]) [phone] Hours of Operation:  9 am - 6 pm, M-F.  Also accepts Medicaid/Medicare and self-pay.  [facility]  [address], [place] Phone: [phone](336) [phone], Fax: [phone]. Hours of Operation:  8:30 am - 5:30 pm, M-F.  Also accepts Medicaid and self-pay.  [facility] [place] [address], [place] Phone: [phone]) [phone]   [facility] [address],  [place], [place] [phone], Ext. [phone] Mondays & Thursdays: 7-9 AM.  First 15 patients are seen on a first come, first serve basis.    Medicaid-accepting [facility] Providers:  Organization         Address  Phone   Notes  [facility] [address] [place] [address], [place] [phone]) [phone] Also accepts self-pay patients.  [facility] [address], [place]  [phone]) [phone]   [facility] [address], [place] [phone]   [facility] [address] [place] [address], [place] [phone]) [phone]   Renaye RakersVeita Bland [address], [place]   [phone] Only accepts [place] Access [place] patients after they have their name applied to their card.   Self-Pay (no insurance) in [facility]:  Organization         Address  Phone   Notes  Sickle Cell Patients, [facility] [address], [place] [phone]   [facility] [address], [place] [phone]) [phone]   [facility]  [address], [place] [phone]   [facility]/Dr. Osei-Bonsu  [address] [place] [address], [place] or [phone], [place] [phone]) [phone] Phone number for both [place] and [place] locations is the same.  [facility] [address], [place] [phone]) [phone]   [facility] [place] [address] [place] [address], [place] or [address] [phone]   [facility] [address], [place] [phone]) [phone], phone; [phone](336) [phone], fax Sees patients 1st and 3rd Saturday of every month.  Must not qualify for public or private insurance (i.e. Medicaid, Medicare, [place] Health Choice, Veterans' Benefits)  Household income should be no more than 200% of the poverty level The clinic cannot treat you if you are pregnant or think you are pregnant   Sexually transmitted diseases are not treated at the clinic.    Dental Care: Organization         Address  Phone  Notes  [facility] [address], [place] [phone]) [phone] Accepts children up to age 36 who are enrolled in [place] or [place] Health Choice; pregnant women with a Medicaid card; and children who have applied for Medicaid or [place] Health Choice, but were declined, whose parents can pay a reduced fee at time of service.  [facility] [place]  [address], [place] [phone] Accepts children up to age 36 who are enrolled in [place] or [place] Health Choice; pregnant women with a Medicaid card; and children who have applied for Medicaid or [place]  Health Choice, but were declined, whose parents can pay a reduced fee at time of service.  [facility]  [address] [place], [place] [phone] Patients are seen by appointment only. Walk-ins are not accepted. [facility] will see patients 40 years of age and older. Monday - Tuesday (8am-5pm) Most Wednesdays (8:30-5pm) $30 per visit, cash only  [facility]  [address], [facility] [phone] Patients are seen by appointment only. Walk-ins are not accepted. [facility] will see patients 89 years of age and older. One Wednesday Evening (Monthly: Volunteer Based).  $30 per visit, cash only  [employer]  [phone] for adults; Children under age 55, call Graduate Pediatric Dentistry at [phone]. Children aged 108-14, please call [phone] to request a pediatric application.  Dental services are provided in all areas of dental care including fillings, crowns and bridges, complete and partial dentures, implants, gum treatment, root canals, and extractions. Preventive care is also provided. Treatment is provided to both adults and children. Patients  are selected via a lottery and there is often a waiting list.   [facility] [address] [place]  [phone] [email or web address]   [facility] [address] [place], [place] [phone], Ext. [phone] Second and Fourth Thursday of each month, opens at 6:30 AM; Clinic ends at 9 AM.  Patients are seen on a first-come first-served basis, and a limited number are seen during each clinic.   [facility]  [address] [place], [place] [phone]   Eligibility Requirements You must have lived in [place], [place], or [place] counties for at least the last three months.   You cannot be eligible for state or federal sponsored [place], including [employer], [place], or [employer].   You generally cannot be eligible for healthcare insurance through your employer.    How to apply: Eligibility screenings are held every Tuesday and Wednesday afternoon from 1:00 pm until 4:00 pm. You do not need an appointment for the interview!  [facility] [address], [place], [place] [phone]   [facility] Health Department  [phone]   [facility] Health Department  [phone]   [facility] Health Department  [phone]    Behavioral Health Resources in the Community: Intensive Outpatient Programs Organization         Address  Phone  Notes  [facility] [address], [place], [place] [phone]   [facility] Outpatient [address], [place], [place] [phone]   [facility] [address], [place], [place]  [phone]   [facility] [address]  [place], [place] [phone] or [phone]   Substance Abuse Resources Organization         Address  Phone  Notes  [facility] Drug [facility]  [phone]   [facility]  [phone]   [facility] [place]  [phone]   Floydene Flock  [phone]    Residential & Outpatient Substance Abuse Program  [phone]   Psychological Services Organization         Address  Phone  Notes  [facility]  [phone]   [facility]  [phone]   [facility] [address], [place] [phone] or [phone]    Mobile Crisis Teams Organization         Address  Phone  Notes  [facility], Mobile Crisis Care Unit  [phone]   [facility]  [address] [place], [place] [phone]   [facility] [address] [place] [phone]    Self-Help/Support Groups Organization         Address  Phone             Notes  [facility] [place] - variety of support groups  [phone] Call for more information  Narcotics Anonymous (NA), [facility] [address], [place]  2 meetings at this location   Statisticianesidential Treatment Programs Organization         Address  Phone  Notes  [facility] [address],    [place]  [phone]   [facility]  [address], [place] [phone], [place], [place] [phone]   [facility] [address], [place] [phone] Admissions: 8am-3pm M-F  [facility] [address]    [place], [place] [phone]   [facility] [address], [place], [place] [phone]   [facility] [address]  [place], [place] [phone]   [facility] - Intensive Outpatient [address], [place], [place] [phone]   [facility] ([facility] [address]  [place], [place] [phone] or [phone]   [facility] ([facility]) [address] [place], [place] [phone] Accepts Medicaid  [facility] [address]  [place] [phone] Substance Abuse/Addiction Treatment   [facility]  Resources Organization         Address  Phone  Notes  [facility]  [phone](888) [phone]   Angie FavaJulie Brannon, PhD [address], [address] [place], [place]   [phone](336) [phone] or [phone]) [phone]   [facility]   [address] [place], [place] [phone]) [phone]   [facility] [address], [place], [place] [phone](336) [phone] Insurance/Medicaid/sponsorship through [facility]  [facility] [address]                                    [place], [place] [phone]) [phone] Therapy/tele-psych/case  [facility] [address]   [place], [place] [phone]) [phone]    Dr. Lolly MustacheArfeen  [phone](336) [phone]   [facility]  [employer] [facility] 1) [address], [place] 2) [address], [place] 3)  [address], [place] [phone]) [phone]) [phone]  [phone]) [phone]   [facility] Child Abuse Hotline [phone](336) [phone] or [phone]) [phone] (After Hours)

## 2014-11-15 NOTE — ED Notes (Signed)
Pt got angry at MD and NP during assessment. He cursed and eventually hit the wall. Per MD, pt will be discharged immediately

## 2014-11-15 NOTE — ED Notes (Addendum)
The patient has a history of migraines and he is here today due to the worst headache he has ever had.  He is having some nausea and he said he vomited about 15 minutes prior to EMS arrival.  He rate his pain 9/10. The patient is sensitive to light.

## 2014-11-15 NOTE — ED Notes (Signed)
[facility]  Pt became aggressive when being discharged and punched a wall when he was told that this hospital was not going to give him a ride back [place].  A bus pass was provided and pt stated that he was going to go to another hospital.  Pt ended up being escorted off the property.  Pt [facility] and stated he was suicidal with plan to jump off a bridge or jump in front of a car.

## 2014-11-16 ENCOUNTER — Emergency Department (HOSPITAL_COMMUNITY)
Admission: EM | Admit: 2014-11-16 | Discharge: 2014-11-16 | Disposition: A | Discharge status: Home / Self Care | Payer: Self-pay | Attending: Emergency Medicine | Admitting: Emergency Medicine

## 2014-11-16 DIAGNOSIS — G43009 Migraine without aura, not intractable, without status migrainosus: Secondary | ICD-10-CM

## 2014-11-16 MED ORDER — LORAZEPAM 2 MG/ML IJ SOLN
1.0000 mg | Freq: Once | INTRAMUSCULAR | Status: AC
  Administered 2014-11-16: 1 mg via INTRAVENOUS
  Filled 2014-11-16: qty 1

## 2014-11-16 MED ORDER — DEXAMETHASONE SODIUM PHOSPHATE 10 MG/ML IJ SOLN
10.0000 mg | Freq: Once | INTRAMUSCULAR | Status: AC
  Administered 2014-11-16: 10 mg via INTRAVENOUS
  Filled 2014-11-16: qty 1

## 2014-11-16 MED ORDER — METOCLOPRAMIDE HCL 5 MG/ML IJ SOLN
10.0000 mg | Freq: Once | INTRAMUSCULAR | Status: AC
Start: 2014-11-16 — End: 2014-11-16
  Administered 2014-11-16: 10 mg via INTRAVENOUS
  Filled 2014-11-16: qty 2

## 2014-11-16 MED ORDER — IBUPROFEN 600 MG PO TABS: 600.0000 mg | ORAL_TABLET | Freq: Four times a day (QID) | ORAL | Status: AC | PRN

## 2014-11-16 MED ORDER — KETOROLAC TROMETHAMINE 30 MG/ML IJ SOLN
30.0000 mg | Freq: Once | INTRAMUSCULAR | Status: AC
Start: 2014-11-16 — End: 2014-11-16
  Administered 2014-11-16: 30 mg via INTRAVENOUS
  Filled 2014-11-16: qty 1

## 2014-11-16 MED ORDER — ONDANSETRON 8 MG PO TBDP: 8.0000 mg | ORAL_TABLET | Freq: Three times a day (TID) | ORAL | Status: AC | PRN

## 2014-11-16 MED ORDER — OXYCODONE-ACETAMINOPHEN 5-325 MG PO TABS
1.0000 | ORAL_TABLET | Freq: Four times a day (QID) | ORAL | Status: AC | PRN
Start: 2014-11-16 — End: ?

## 2014-11-16 MED ORDER — FENTANYL CITRATE 0.05 MG/ML IJ SOLN
50.0000 ug | Freq: Once | INTRAMUSCULAR | Status: AC
  Administered 2014-11-16: 50 ug via INTRAVENOUS
  Filled 2014-11-16: qty 2

## 2014-11-16 MED ORDER — KETOROLAC TROMETHAMINE 30 MG/ML IJ SOLN
30.0000 mg | Freq: Once | INTRAMUSCULAR | Status: DC
  Filled 2014-11-16: qty 1

## 2014-11-16 MED ORDER — DIPHENHYDRAMINE HCL 50 MG/ML IJ SOLN
25.0000 mg | Freq: Once | INTRAMUSCULAR | Status: AC
  Administered 2014-11-16: 25 mg via INTRAVENOUS
  Filled 2014-11-16: qty 1

## 2014-11-16 MED ORDER — SODIUM CHLORIDE 0.9 % IV BOLUS (SEPSIS)
1000.0000 mL | Freq: Once | INTRAVENOUS | Status: AC
  Administered 2014-11-16: 1000 mL via INTRAVENOUS

## 2014-11-16 NOTE — ED Provider Notes (Signed)
CSN: [id number]     Arrival date & time 11/15/14  2324 History   First MD Initiated Contact with Patient 11/16/14 0130     This chart was scribed for No att. providers found by Arlan OrganAshley Leger, ED Scribe. This patient was seen in room D31C/D31C and the patient's care was started 8:17 AM.   Chief Complaint  Patient presents with  . Migraine    The patient has a history of migraines and he is here today due to the worst headache he has ever had.     The history is provided by the patient. No language interpreter was used.    HPI Comments: Mark [name] brought in by ambulance is a 34 y.o. male with a PMHx of depression, PTSD, seizures, migraines, hyperlipidemia, and thyroid disease who presents to the Emergency Department complaining of constant, ongoing, recurrent migraine onset 30 minutes prior to EMS arrival. Pain rated 8/10 at this time. Pt also reports associated dizziness, nausea, vomiting, visual changes, and diaphoresis. He has tried OTC Ibuprofen without any improvement for symptoms. No recent fever or chills. No history of HTN or substance abuse. Mark [name] is not currently followed by a neurologist. No known allergies to medications.  Past Medical History  Diagnosis Date  . Depression   . PTSD (post-traumatic stress disorder)   . Social anxiety disorder   . Seizures   . Adrenal insufficiency   . Hyperlipidemia   . Thyroid disease    Past Surgical History  Procedure Laterality Date  . Ear tube removal     History reviewed. No pertinent family history. History  Substance Use Topics  . Smoking status: Current Every Day Smoker  . Smokeless tobacco: Not on file  . Alcohol Use: No    Review of Systems  Constitutional: Positive for diaphoresis. Negative for fever and chills.  Eyes: Positive for photophobia and visual disturbance.  Respiratory: Negative for cough.   Cardiovascular: Negative for chest pain.  Gastrointestinal: Positive for nausea and vomiting.  Musculoskeletal:  Negative for back pain.  Skin: Negative for rash.  Neurological: Positive for headaches.  Psychiatric/Behavioral: Negative for confusion.  All other systems reviewed and are negative.     Allergies  Review of patient's allergies indicates no known allergies.  Home Medications   Prior to Admission medications   Medication Sig Start Date End Date Taking? Authorizing Provider  DULoxetine (CYMBALTA) 60 MG capsule Take 60 mg by mouth daily.   Yes Historical Provider, MD  fenofibrate 54 MG tablet Take 54 mg by mouth daily.   Yes Historical Provider, MD  fluticasone (FLONASE) 50 MCG/ACT nasal spray Place 2 sprays into both nostrils daily as needed for allergies.    Yes Historical Provider, MD  hydrocortisone (CORTEF) 20 MG tablet Take 30 mg by mouth 2 (two) times daily.    Yes Historical Provider, MD  levothyroxine (SYNTHROID, LEVOTHROID) 100 MCG tablet Take 100 mcg by mouth daily before breakfast.   Yes Historical Provider, MD  omega-3 acid ethyl esters (LOVAZA) 1 G capsule Take 2 g by mouth 2 (two) times daily.   Yes Historical Provider, MD  risperidone (RISPERDAL) 4 MG tablet Take 4 mg by mouth at bedtime.   Yes Historical Provider, MD  ibuprofen (ADVIL,MOTRIN) 600 MG tablet Take 1 tablet (600 mg total) by mouth every 6 (six) hours as needed. 11/16/14   Derwood KaplanAnkit Izzy Doubek, MD  ondansetron (ZOFRAN ODT) 8 MG disintegrating tablet Take 1 tablet (8 mg total) by mouth every 8 (eight) hours as  needed for nausea. 11/16/14   Derwood Kaplan, MD  oxyCODONE-acetaminophen (PERCOCET/ROXICET) 5-325 MG per tablet Take 1 tablet by mouth every 6 (six) hours as needed for severe pain. 11/16/14   Derwood Kaplan, MD   Triage Vitals: BP 104/53 mmHg  Pulse 85  Temp(Src) 98 F (36.7 C) (Oral)  Resp 16  Wt 294 lb 2 oz (133.414 kg)  SpO2 100%   Physical Exam  Constitutional: He is oriented to person, place, and time. He appears well-developed and well-nourished.  HENT:  Head: Normocephalic and atraumatic.  Eyes:  EOM are normal.  Pupils 3 mm and equal and reactive to light  Neck: Normal range of motion.  Cardiovascular: Normal rate, regular rhythm, normal heart sounds and intact distal pulses.   Pulmonary/Chest: Effort normal and breath sounds normal. No respiratory distress.  Lungs clear to auscultation   Abdominal: Soft. He exhibits no distension. There is no tenderness.  Musculoskeletal: Normal range of motion.  Neurological: He is alert and oriented to person, place, and time.  Normal finger to nose testing Cranial nerves 2-12 intact Normal cerebellar testing  Skin: Skin is warm and dry.  Psychiatric: He has a normal mood and affect. Judgment normal.  Nursing note and vitals reviewed.   ED Course  Procedures (including critical care time)   am Pt reassessed earlier and again just now. Pain slightly better, but not resolved. States he needs dilaudid. Notes indicate that he has had substance abuse problems in the past, and is drug seeking. 1 more round of medicine to be given and will include fentanyl 50 mcg. We will discharge thereafter. Pt has no life threatening cause of headache per our evaluation.  DIAGNOSTIC STUDIES: Oxygen Saturation is 93% on RA, adequate by my interpretation.    COORDINATION OF CARE: 8:17 AM-Discussed treatment plan with pt at bedside and pt agreed to plan.     Labs Review Labs Reviewed - No data to display  Imaging Review No results found.   EKG Interpretation None      MDM   Final diagnoses:  Nonintractable migraine, unspecified migraine type    I personally performed the services described in this documentation, which was scribed in my presence. The recorded information has been reviewed and is accurate.  Pt comes in with headaches. Reports hx of migraines and similar headaches currently. Will give headache cocktail. No focal neuro deficits on exam, sx consistent with migraines.  Derwood Kaplan, MD 11/17/14 [phone]

## 2014-11-16 NOTE — Discharge Instructions (Signed)
We saw you in the ER for headaches, appears to be migraines.  Please take motrin round the clock for the next 6 hours, and take other meds prescribed only for break through pain. See your doctor if the pain persists, as you might need better medications or a specialist.   Migraine Headache A migraine headache is an intense, throbbing pain on one or both sides of your head. A migraine can last for 30 minutes to several hours. CAUSES  The exact cause of a migraine headache is not always known. However, a migraine may be caused when nerves in the brain become irritated and release chemicals that cause inflammation. This causes pain. Certain things may also trigger migraines, such as:  Alcohol.  Smoking.  Stress.  Menstruation.  Aged cheeses.  Foods or drinks that contain nitrates, glutamate, aspartame, or tyramine.  Lack of sleep.  Chocolate.  Caffeine.  Hunger.  Physical exertion.  Fatigue.  Medicines used to treat chest pain (nitroglycerine), birth control pills, estrogen, and some blood pressure medicines. SIGNS AND SYMPTOMS  Pain on one or both sides of your head.  Pulsating or throbbing pain.  Severe pain that prevents daily activities.  Pain that is aggravated by any physical activity.  Nausea, vomiting, or both.  Dizziness.  Pain with exposure to bright lights, loud noises, or activity.  General sensitivity to bright lights, loud noises, or smells. Before you get a migraine, you may get warning signs that a migraine is coming (aura). An aura may include:  Seeing flashing lights.  Seeing bright spots, halos, or zigzag lines.  Having tunnel vision or blurred vision.  Having feelings of numbness or tingling.  Having trouble talking.  Having muscle weakness. DIAGNOSIS  A migraine headache is often diagnosed based on:  Symptoms.  Physical exam.  A CT scan or MRI of your head. These imaging tests cannot diagnose migraines, but they can help rule  out other causes of headaches. TREATMENT Medicines may be given for pain and nausea. Medicines can also be given to help prevent recurrent migraines.  HOME CARE INSTRUCTIONS  Only take over-the-counter or prescription medicines for pain or discomfort as directed by your health care provider. The use of long-term narcotics is not recommended.  Lie down in a dark, quiet room when you have a migraine.  Keep a journal to find out what may trigger your migraine headaches. For example, write down:  What you eat and drink.  How much sleep you get.  Any change to your diet or medicines.  Limit alcohol consumption.  Quit smoking if you smoke.  Get 7-9 hours of sleep, or as recommended by your health care provider.  Limit stress.  Keep lights dim if bright lights bother you and make your migraines worse. SEEK IMMEDIATE MEDICAL CARE IF:   Your migraine becomes severe.  You have a fever.  You have a stiff neck.  You have vision loss.  You have muscular weakness or loss of muscle control.  You start losing your balance or have trouble walking.  You feel faint or pass out.  You have severe symptoms that are different from your first symptoms. MAKE SURE YOU:   Understand these instructions.  Will watch your condition.  Will get help right away if you are not doing well or get worse. Document Released: 07/23/2005 Document Revised: 12/07/2013 Document Reviewed: 03/30/2013 [facility] Patient Information 2015 [employer], [place]. This information is not intended to replace advice given to you by your health care provider. Make  sure you discuss any questions you have with your health care provider.

## 2015-10-20 IMAGING — CR DG HAND COMPLETE 3+V*R*
3 series · 3 of 3 positions shown · non-contrast
Comparison: 10/29/2014

CLINICAL DATA: Pain in the right hand after hitting wall a few days
ago. Initial encounter.

EXAM:
RIGHT HAND - COMPLETE 3+ VIEW

[x hand pa right (1 of 2)]
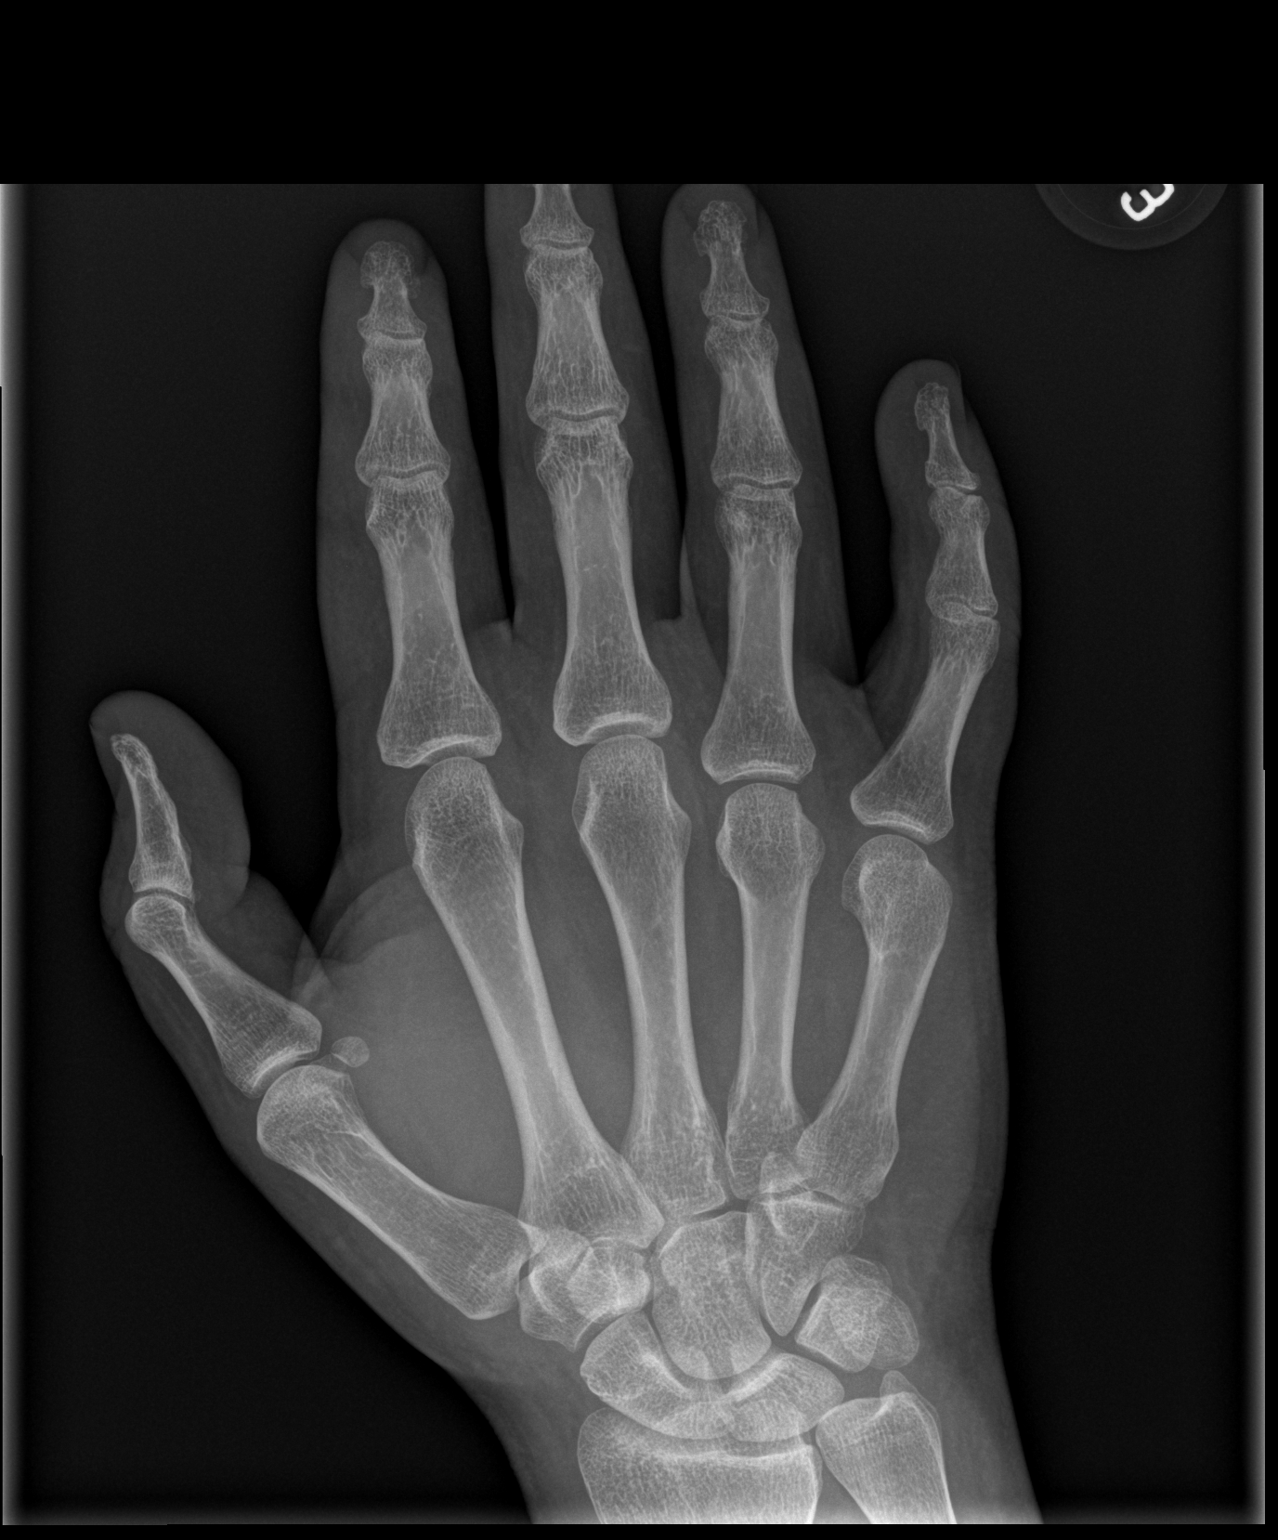

[x hand pa right (2 of 2)]
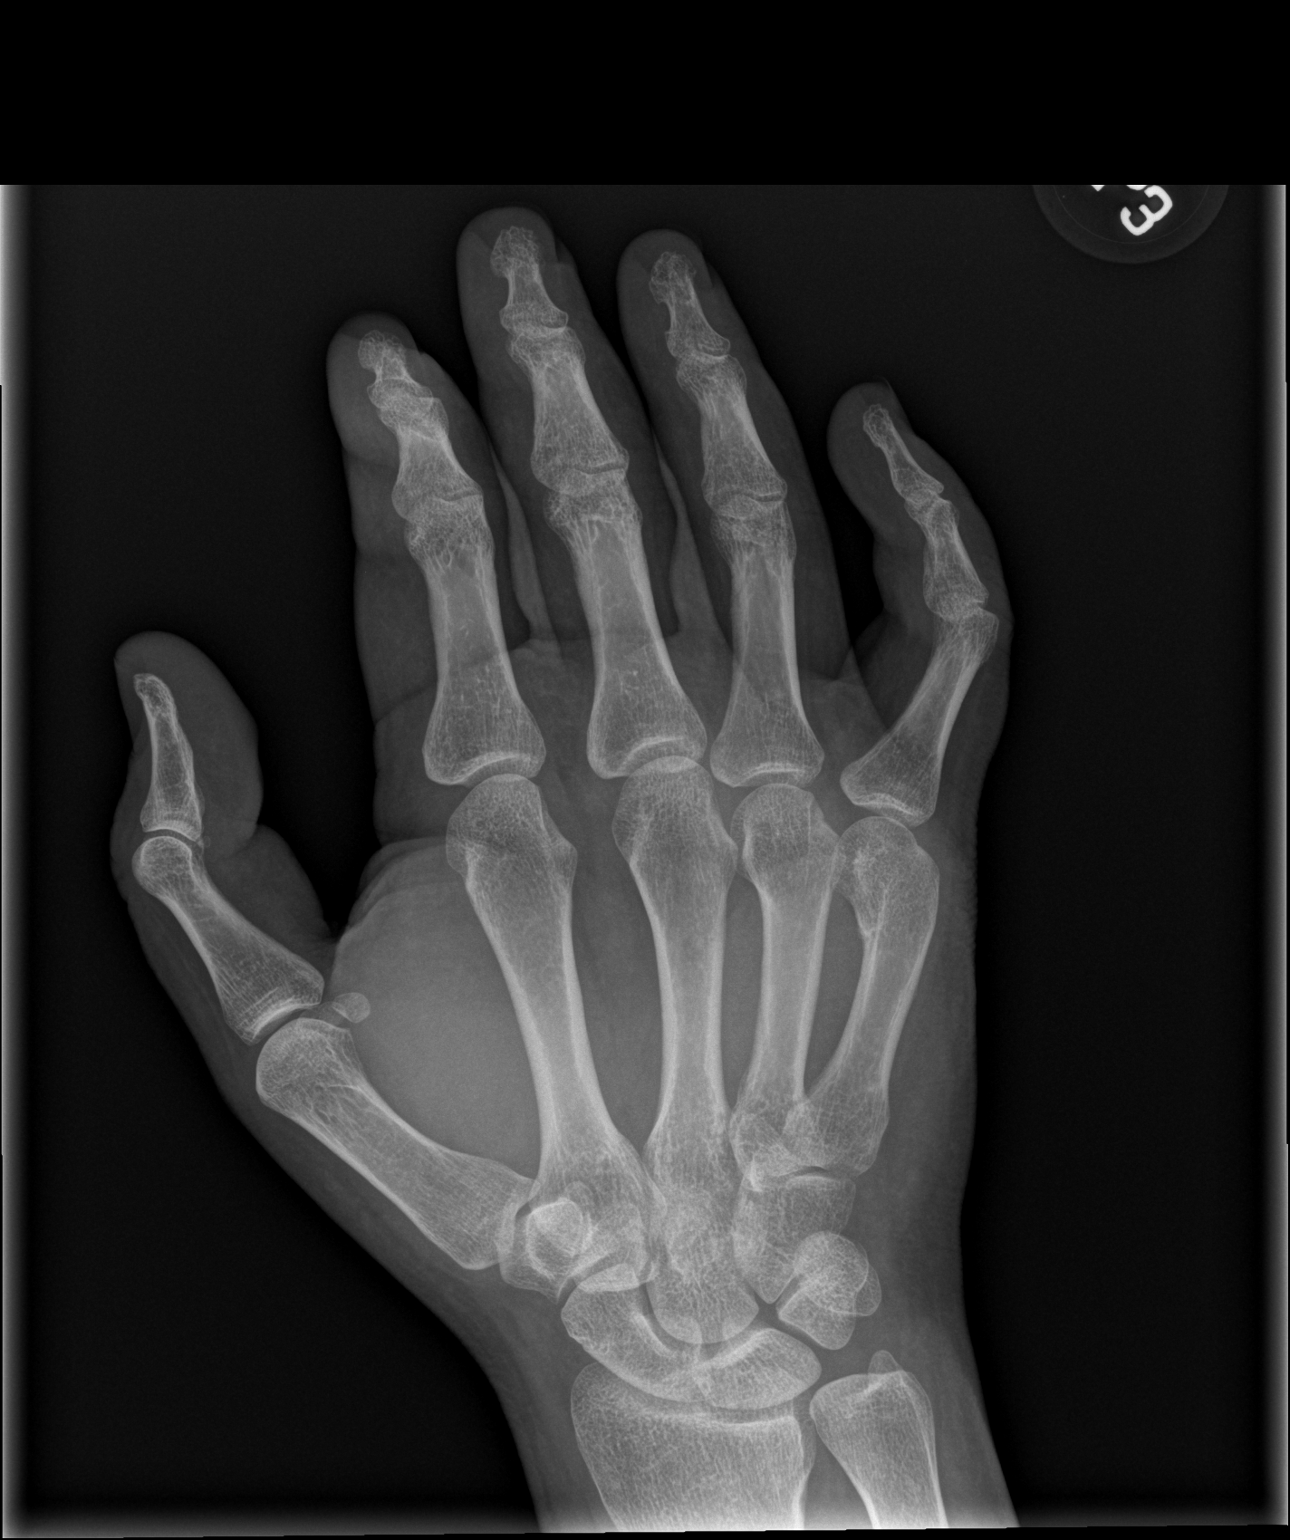

[x hand lat right]
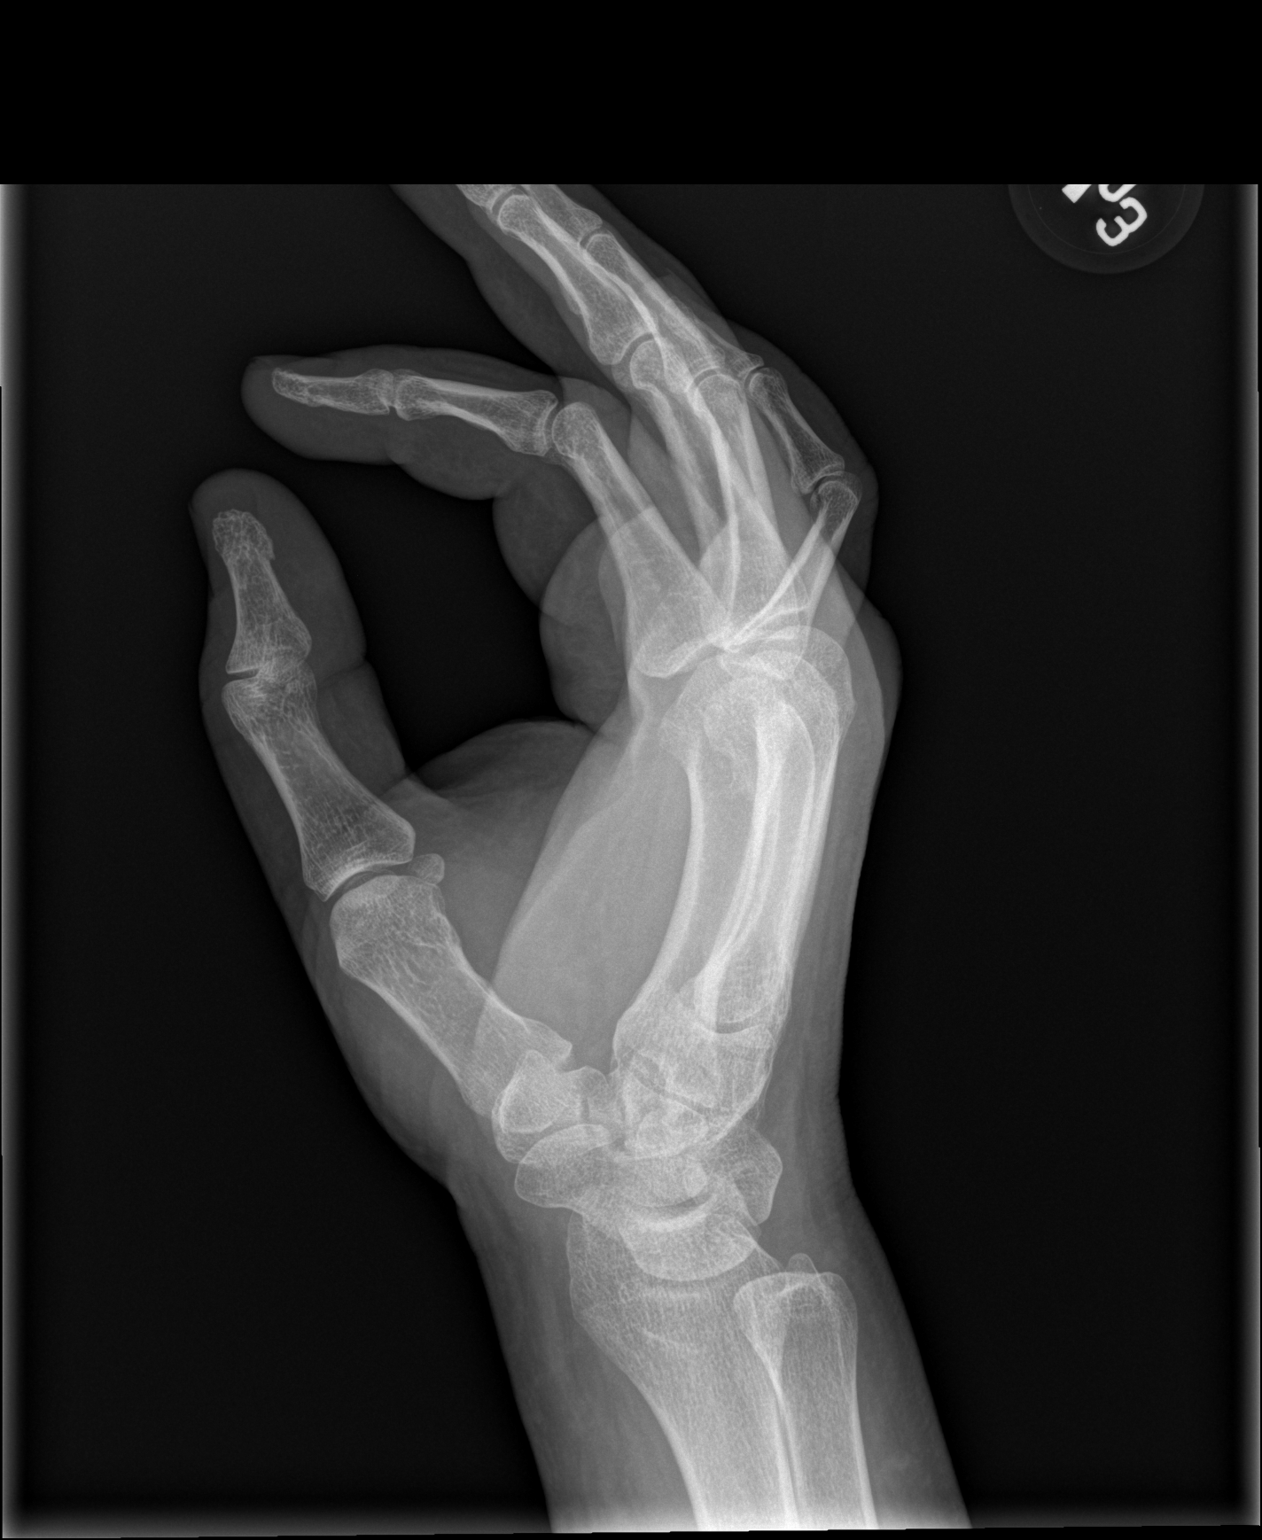

[3 of 3 positions shown; findings below may reference images not displayed]

FINDINGS: Remote and healed boxer's fracture. No acute fracture or
dislocation. No acute soft tissue findings.
IMPRESSION: No acute osseous findings.
